# Patient Record
Sex: Female | Born: 1955 | Race: White | Hispanic: No | Marital: Married | Smoking: Never smoker
Health system: Southern US, Community
[De-identification: ages and names within clinical notes are randomized; demographics above are authoritative.]

## PROBLEM LIST (undated history)

## (undated) DIAGNOSIS — N946 Dysmenorrhea, unspecified: Secondary | ICD-10-CM

## (undated) DIAGNOSIS — Z87442 Personal history of urinary calculi: Secondary | ICD-10-CM

## (undated) DIAGNOSIS — I499 Cardiac arrhythmia, unspecified: Secondary | ICD-10-CM

## (undated) DIAGNOSIS — M1990 Unspecified osteoarthritis, unspecified site: Secondary | ICD-10-CM

## (undated) DIAGNOSIS — I10 Essential (primary) hypertension: Secondary | ICD-10-CM

## (undated) HISTORY — DX: Unspecified osteoarthritis, unspecified site: M19.90

---

## 1985-05-20 HISTORY — PX: TUBAL LIGATION: SHX77

## 1998-03-21 ENCOUNTER — Other Ambulatory Visit: Admission: RE | Admit: 1998-03-21 | Discharge: 1998-03-21 | Payer: Self-pay | Admitting: Obstetrics & Gynecology

## 2000-04-01 ENCOUNTER — Other Ambulatory Visit: Admission: RE | Admit: 2000-04-01 | Discharge: 2000-04-01 | Payer: Self-pay | Admitting: Obstetrics & Gynecology

## 2001-09-03 ENCOUNTER — Other Ambulatory Visit: Admission: RE | Admit: 2001-09-03 | Discharge: 2001-09-03 | Payer: Self-pay | Admitting: Obstetrics & Gynecology

## 2012-05-20 HISTORY — PX: CARPAL TUNNEL RELEASE: SHX101

## 2014-05-20 HISTORY — PX: REPLACEMENT TOTAL KNEE BILATERAL: SUR1225

## 2017-02-21 ENCOUNTER — Telehealth: Payer: Self-pay | Admitting: Gynecologic Oncology

## 2017-02-21 ENCOUNTER — Encounter: Payer: Self-pay | Admitting: Gynecologic Oncology

## 2017-02-21 NOTE — Telephone Encounter (Signed)
Appt has been scheduled for the pt to see Dr. Denman George on 10/15 at [facility] . Pt aware to arrive 30 minutes early. Address and insurance verified. Letter mailed.

## 2017-03-03 ENCOUNTER — Other Ambulatory Visit (HOSPITAL_BASED_OUTPATIENT_CLINIC_OR_DEPARTMENT_OTHER): Payer: 59

## 2017-03-03 ENCOUNTER — Encounter: Payer: Self-pay | Admitting: Gynecologic Oncology

## 2017-03-03 ENCOUNTER — Ambulatory Visit: Payer: 59 | Attending: Gynecologic Oncology | Admitting: Gynecologic Oncology

## 2017-03-03 VITALS — BP 138/58 | HR 71 | Temp 98.1°F | Resp 18 | Ht 66.0 in | Wt 196.7 lb

## 2017-03-03 DIAGNOSIS — Z800 Family history of malignant neoplasm of digestive organs: Secondary | ICD-10-CM | POA: Insufficient documentation

## 2017-03-03 DIAGNOSIS — R978 Other abnormal tumor markers: Secondary | ICD-10-CM | POA: Insufficient documentation

## 2017-03-03 DIAGNOSIS — M1990 Unspecified osteoarthritis, unspecified site: Secondary | ICD-10-CM | POA: Insufficient documentation

## 2017-03-03 DIAGNOSIS — N838 Other noninflammatory disorders of ovary, fallopian tube and broad ligament: Secondary | ICD-10-CM

## 2017-03-03 DIAGNOSIS — Z9851 Tubal ligation status: Secondary | ICD-10-CM | POA: Insufficient documentation

## 2017-03-03 DIAGNOSIS — I10 Essential (primary) hypertension: Secondary | ICD-10-CM | POA: Insufficient documentation

## 2017-03-03 DIAGNOSIS — Z79899 Other long term (current) drug therapy: Secondary | ICD-10-CM | POA: Insufficient documentation

## 2017-03-03 DIAGNOSIS — R7989 Other specified abnormal findings of blood chemistry: Secondary | ICD-10-CM | POA: Diagnosis not present

## 2017-03-03 DIAGNOSIS — Z87410 Personal history of cervical dysplasia: Secondary | ICD-10-CM | POA: Insufficient documentation

## 2017-03-03 DIAGNOSIS — N839 Noninflammatory disorder of ovary, fallopian tube and broad ligament, unspecified: Secondary | ICD-10-CM | POA: Diagnosis not present

## 2017-03-03 DIAGNOSIS — N8500 Endometrial hyperplasia, unspecified: Secondary | ICD-10-CM | POA: Diagnosis not present

## 2017-03-03 DIAGNOSIS — Z96653 Presence of artificial knee joint, bilateral: Secondary | ICD-10-CM | POA: Insufficient documentation

## 2017-03-03 LAB — COMPREHENSIVE METABOLIC PANEL
ALBUMIN: 3.7 g/dL (ref 3.5–5.0)
ALK PHOS: 74 U/L (ref 40–150)
ALT: 14 U/L (ref 0–55)
AST: 20 U/L (ref 5–34)
Anion Gap: 7 mEq/L (ref 3–11)
BUN: 9.7 mg/dL (ref 7.0–26.0)
CHLORIDE: 107 meq/L (ref 98–109)
CO2: 25 mEq/L (ref 22–29)
Calcium: 9.6 mg/dL (ref 8.4–10.4)
Creatinine: 0.7 mg/dL (ref 0.6–1.1)
EGFR: >60 mL/min/{1.73_m2} (ref >60)
GLUCOSE: 106 mg/dL (ref 70–140)
POTASSIUM: 4.1 meq/L (ref 3.5–5.1)
SODIUM: 139 meq/L (ref 136–145)
Total Bilirubin: 0.32 mg/dL (ref 0.20–1.20)
Total Protein: 7 g/dL (ref 6.4–8.3)

## 2017-03-03 NOTE — Patient Instructions (Signed)
Plan on having a CT scan of the abdomen and pelvis before surgery.  We will contact you with the results.                Preparing for your Surgery  Plan for surgery on April 15, 2017 with Dr. Everitt Amber at [facility] will be scheduled for a robotic assisted total hysterectomy, bilateral salpingo-oophorectomy, possible staging.  Pre-operative Testing -You will receive a phone call from presurgical testing at [facility] to arrange for a pre-operative testing appointment before your surgery.  This appointment normally occurs one to two weeks before your scheduled surgery.   -Bring your insurance card, copy of an advanced directive if applicable, medication list  -At that visit, you will be asked to sign a consent for a possible blood transfusion in case a transfusion becomes necessary during surgery.  The need for a blood transfusion is rare but having consent is a necessary part of your care.     -You should not be taking blood thinners or aspirin at least ten days prior to surgery unless instructed by your surgeon.  Day Before Surgery at [place] will be asked to take in a light diet the day before surgery.  Avoid carbonated beverages.  You will be advised to have nothing to eat or drink after midnight the evening before.    Eat a light diet the day before surgery.  Examples including soups, broths, toast, yogurt, mashed potatoes.  Things to avoid include carbonated beverages (fizzy beverages), raw fruits and raw vegetables, or beans.   If your bowels are filled with gas, your surgeon will have difficulty visualizing your pelvic organs which increases your surgical risks.  Your role in recovery Your role is to become active as soon as directed by your doctor, while still giving yourself time to heal.  Rest when you feel tired. You will be asked to do the following in order to speed your recovery:  - Cough and breathe deeply. This helps toclear and expand your  lungs and can prevent pneumonia. You may be given a spirometer to practice deep breathing. A staff member will show you how to use the spirometer. - Do mild physical activity. Walking or moving your legs help your circulation and body functions return to normal. A staff member will help you when you try to walk and will provide you with simple exercises. Do not try to get up or walk alone the first time. - Actively manage your pain. Managing your pain lets you move in comfort. We will ask you to rate your pain on a scale of zero to 10. It is your responsibility to tell your doctor or nurse where and how much you hurt so your pain can be treated.  Special Considerations -If you are diabetic, you may be placed on insulin after surgery to have closer control over your blood sugars to promote healing and recovery.  This does not mean that you will be discharged on insulin.  If applicable, your oral antidiabetics will be resumed when you are tolerating a solid diet.  -Your final pathology results from surgery should be available by the Friday after surgery and the results will be relayed to you when available.  -Dr. Lahoma Crocker is the Surgeon that assists your GYN Oncologist with surgery.  The next day after your surgery you will either see your GYN Oncologist or Dr. Lahoma Crocker.   Blood Transfusion Information WHAT IS A BLOOD TRANSFUSION?  A transfusion is the replacement of blood or some of its parts. Blood is made up of multiple cells which provide different functions.  Red blood cells carry oxygen and are used for blood loss replacement.  White blood cells fight against infection.  Platelets control bleeding.  Plasma helps clot blood.  Other blood products are available for specialized needs, such as hemophilia or other clotting disorders. BEFORE THE TRANSFUSION  Who gives blood for transfusions?   You may be able to donate blood to be used at a later date on yourself  (autologous donation).  Relatives can be asked to donate blood. This is generally not any safer than if you have received blood from a stranger. The same precautions are taken to ensure safety when a relative's blood is donated.  Healthy volunteers who are fully evaluated to make sure their blood is safe. This is blood bank blood. Transfusion therapy is the safest it has ever been in the practice of medicine. Before blood is taken from a donor, a complete history is taken to make sure that person has no history of diseases nor engages in risky social behavior (examples are intravenous drug use or sexual activity with multiple partners). The donor's travel history is screened to minimize risk of transmitting infections, such as malaria. The donated blood is tested for signs of infectious diseases, such as HIV and hepatitis. The blood is then tested to be sure it is compatible with you in order to minimize the chance of a transfusion reaction. If you or a relative donates blood, this is often done in anticipation of surgery and is not appropriate for emergency situations. It takes many days to process the donated blood. RISKS AND COMPLICATIONS Although transfusion therapy is very safe and saves many lives, the main dangers of transfusion include:   Getting an infectious disease.  Developing a transfusion reaction. This is an allergic reaction to something in the blood you were given. Every precaution is taken to prevent this. The decision to have a blood transfusion has been considered carefully by your caregiver before blood is given. Blood is not given unless the benefits outweigh the risks.

## 2017-03-03 NOTE — Progress Notes (Signed)
Consult Note: Gyn-Onc  Consult was requested by Dr. Deatra Ina for the evaluation of Christina Hines 60 y.o. female  CC:  Chief Complaint  Patient presents with  . Endometrial hyperplasia  . right ovarian mass  . elevated inhibin B    Assessment/Plan:  Christina Hines  is a 61 y.o.  year old with a solid ovarian (Right) mass, mildly elevated Inhibin B and endometrial hyperplasia. 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  I am recommending robotic assisted total hysterectomy, BSO, and possible staging.   I discussed that most GCT's are early stage and often do not require adjuvant therapy, though that will be determined by the results of staging.  I discussed operative risks including  bleeding, infection, damage to internal organs (such as bladder,ureters, bowels), blood clot, reoperation and rehospitalization.  We will obtain preoperative CT scan to evaluate for occult metastatic disease.   HPI: Christina Hines is a 61 year old woman who is seen in consultation at the request of Dr Deatra Ina for ovarian mass and elevated tumor markers in the setting of endometrial hyperplasia.  The patient reported to her OBGYN at age 61 that she had never been amenorrhea/gone through menopause. This prompted labs to be drawn which showed a low FSH. US pelvis on 01/01/17 showed a globular uterus with thickened endometrium. The right adnexa included a mass measuring 5x4.2x5.4cm.   Endometrial biopsy on 01/01/17 showed benign simple hyperplasia without atypia.  Inhibin B on 01/01/17 was mildly elevated at 17.4 (upper limit is 16.9). Repeat Inhibin B on 02/06/17 showed It to have increased to 21.4.  A mirena IUD was placed on 02/06/17 to provide progesterone to the endometrium in the setting of endogenous unopposed estrogen and hyperplasia.  The patient is otherwise fairly healthy. She has had 2 cesarean sections and a tubal ligation. She has a remote history (20+  years ago) of high grade cervical dysplasia requiring CKC. She has had normal paps since that time.   Current Meds:  Outpatient Encounter Prescriptions as of 03/03/2017  Medication Sig  . CALCIUM PO Take 300 mg by mouth daily.  . Cholecalciferol (VITAMIN D3) 2000 units TABS Take 1 tablet by mouth daily.  Marland Kitchen losartan (COZAAR) 50 MG tablet losartan 50 mg tablet  . meloxicam (MOBIC) 15 MG tablet meloxicam 15 mg tablet   No facility-administered encounter medications on file as of 03/03/2017.     Allergy: No Known Allergies  Social Hx:   Social History   Social History  . Marital status: Married    Spouse name: N/A  . Number of children: N/A  . Years of education: N/A   Occupational History  . Not on file.   Social History Main Topics  . Smoking status: Never Smoker  . Smokeless tobacco: Never Used  . Alcohol use No  . Drug use: Unknown  . Sexual activity: Yes    Birth control/ protection: Surgical, Post-menopausal   Other Topics Concern  . Not on file   Social History Narrative  . No narrative on file    Past Surgical Hx:  Past Surgical History:  Procedure Laterality Date  . CARPAL TUNNEL RELEASE  05/20/2012  . CESAREAN SECTION     x 2  . REPLACEMENT TOTAL KNEE BILATERAL Bilateral 05/20/2014  . TUBAL LIGATION Bilateral 05/20/1985    Past Medical Hx:  Past Medical History:  Diagnosis Date  . Arthritis     Past Gynecological History:  C/s x 2 No  LMP recorded.  Family Hx:  Family History  Problem Relation Age of Onset  . Liver cancer Mother     Review of Systems:  Constitutional  Feels well,    ENT Normal appearing ears and nares bilaterally Skin/Breast  No rash, sores, jaundice, itching, dryness Cardiovascular  No chest pain, shortness of breath, or edema  Pulmonary  No cough or wheeze.  Gastro Intestinal  No nausea, vomitting, or diarrhoea. No bright red blood per rectum, no abdominal pain, change in bowel movement, or constipation.  Genito  Urinary  No frequency, urgency, dysuria,  Musculo Skeletal  No myalgia, arthralgia, joint swelling or pain  Neurologic  No weakness, numbness, change in gait,  Psychology  No depression, anxiety, insomnia.   Vitals:  Blood pressure (!) 138/58, pulse 71, temperature 98.1 F (36.7 C), temperature source Oral, resp. rate 18, height 5\' 6"  (1.676 m), weight 196 lb 11.2 oz (89.2 kg), SpO2 99 %.  Physical Exam: WD in NAD Neck  Supple NROM, without any enlargements.  Lymph Node Survey No cervical supraclavicular or inguinal adenopathy Cardiovascular  Pulse normal rate, regularity and rhythm. S1 and S2 normal.  Lungs  Clear to auscultation bilateraly, without wheezes/crackles/rhonchi. Good air movement.  Skin  No rash/lesions/breakdown  Psychiatry  Alert and oriented to person, place, and time  Abdomen  Normoactive bowel sounds, abdomen soft, non-tender and nonobese without evidence of hernia.  Back No CVA tenderness Genito Urinary  Vulva/vagina: Normal external female genitalia.   No lesions. No discharge or bleeding.  Bladder/urethra:  No lesions or masses, well supported bladder  Vagina: norma  Cervix: Normal appearing, no lesions.  Uterus:  Small, very mobile, no parametrial involvement or nodularity.  Adnexa: Smooth mobile fullness in right adnexa Rectal  deferred Extremities  No bilateral cyanosis, clubbing or edema.   Donaciano Eva, MD  03/03/2017, 10:10 AM

## 2017-03-06 ENCOUNTER — Ambulatory Visit (HOSPITAL_COMMUNITY): Payer: 59

## 2017-03-13 ENCOUNTER — Ambulatory Visit (HOSPITAL_COMMUNITY): Payer: Managed Care, Other (non HMO)

## 2017-03-13 ENCOUNTER — Telehealth: Payer: Self-pay

## 2017-03-13 NOTE — Telephone Encounter (Addendum)
Told Christina Hines that her ct showed the right adnexal mass that they were aware of.  No lymph nodes noted. There is a right kidney cystic lesion that will be followed up on post operatively per Melissa Cross,NP.  She needs to pick up a disc of the Ct scan from 03-11-17 and bring it to the gyn clinic prior to her 04-03-17 surgery.  Pt will bring it by either at her pre-op visit or another day.

## 2017-03-27 NOTE — Patient Instructions (Addendum)
Christina Hines  03/27/2017   Your procedure is scheduled on: 04-03-17  Report to [facility] Main  Entrance Take [place]  elevators to 3rd floor to  [place] at [phone].    Call this number if you have problems the morning of surgery [phone]    Remember: ONLY 1 PERSON MAY GO WITH YOU TO SHORT STAY TO GET  READY MORNING OF [place].     Eat a light diet the day before surgery.  Examples including soups, broths, toast, yogurt, mashed potatoes.  Things to avoid include carbonated beverages (fizzy beverages), raw fruits and raw vegetables, or beans. If your bowels are filled with gas, your surgeon will have difficulty visualizing your pelvic organs which increases your surgical risks.  Do not eat food or drink liquids :After Midnight.     Take these medicines the morning of surgery with A SIP OF WATER: NONE                                You may not have any metal on your body including hair pins and              piercings  Do not wear jewelry, make-up, lotions, powders or perfumes, deodorant             Do not wear nail polish.  Do not shave  48 hours prior to surgery.         Do not bring valuables to the hospital. [place].  Contacts, dentures or bridgework may not be worn into surgery.  Leave suitcase in the car. After surgery it may be brought to your room.               Please read over the following fact sheets you were given: _____________________________________________________________________            [facility] - Preparing for Surgery Before surgery, you can play an important role.  Because skin is not sterile, your skin needs to be as free of germs as possible.  You can reduce the number of germs on your skin by washing with CHG (chlorahexidine gluconate) soap before surgery.  CHG is an antiseptic cleaner which kills germs and bonds with the skin to continue killing germs even after  washing. Please DO NOT use if you have an allergy to CHG or antibacterial soaps.  If your skin becomes reddened/irritated stop using the CHG and inform your nurse when you arrive at Short Stay. Do not shave (including legs and underarms) for at least 48 hours prior to the first CHG shower.  You may shave your face/neck. Please follow these instructions carefully:  1.  Shower with CHG Soap the night before surgery and the  morning of Surgery.  2.  If you choose to wash your hair, wash your hair first as usual with your  normal  shampoo.  3.  After you shampoo, rinse your hair and body thoroughly to remove the  shampoo.                           4.  Use CHG as you would any other liquid soap.  You can apply chg directly  to the skin and wash                       Gently with a scrungie or clean washcloth.  5.  Apply the CHG Soap to your body ONLY FROM THE NECK DOWN.   Do not use on face/ open                           Wound or open sores. Avoid contact with eyes, ears mouth and genitals (private parts).                       Wash face,  Genitals (private parts) with your normal soap.             6.  Wash thoroughly, paying special attention to the area where your surgery  will be performed.  7.  Thoroughly rinse your body with warm water from the neck down.  8.  DO NOT shower/wash with your normal soap after using and rinsing off  the CHG Soap.                9.  Pat yourself dry with a clean towel.            10.  Wear clean pajamas.            11.  Place clean sheets on your bed the night of your first shower and do not  sleep with pets. Day of Surgery : Do not apply any lotions/deodorants the morning of surgery.  Please wear clean clothes to the hospital/surgery center.  FAILURE TO FOLLOW THESE INSTRUCTIONS MAY RESULT IN THE CANCELLATION OF YOUR SURGERY PATIENT SIGNATURE_________________________________  NURSE  SIGNATURE__________________________________  ________________________________________________________________________   Adam Phenix  An incentive spirometer is a tool that can help keep your lungs clear and active. This tool measures how well you are filling your lungs with each breath. Taking long deep breaths may help reverse or decrease the chance of developing breathing (pulmonary) problems (especially infection) following:  A long period of time when you are unable to move or be active. BEFORE THE PROCEDURE   If the spirometer includes an indicator to show your best effort, your nurse or respiratory therapist will set it to a desired goal.  If possible, sit up straight or lean slightly forward. Try not to slouch.  Hold the incentive spirometer in an upright position. INSTRUCTIONS FOR USE  1. Sit on the edge of your bed if possible, or sit up as far as you can in bed or on a chair. 2. Hold the incentive spirometer in an upright position. 3. Breathe out normally. 4. Place the mouthpiece in your mouth and seal your lips tightly around it. 5. Breathe in slowly and as deeply as possible, raising the piston or the ball toward the top of the column. 6. Hold your breath for 3-5 seconds or for as long as possible. Allow the piston or ball to fall to the bottom of the column. 7. Remove the mouthpiece from your mouth and breathe out normally. 8. Rest for a few seconds and repeat Steps 1 through 7 at least 10 times every 1-2 hours when you are awake. Take your time and take a few normal breaths between deep breaths. 9. The spirometer may include an indicator to show your best effort. Use the indicator as a goal to work toward during each repetition. 10. After each  set of 10 deep breaths, practice coughing to be sure your lungs are clear. If you have an incision (the cut made at the time of surgery), support your incision when coughing by placing a pillow or rolled up towels firmly  against it. Once you are able to get out of bed, walk around indoors and cough well. You may stop using the incentive spirometer when instructed by your caregiver.  RISKS AND COMPLICATIONS  Take your time so you do not get dizzy or light-headed.  If you are in pain, you may need to take or ask for pain medication before doing incentive spirometry. It is harder to take a deep breath if you are having pain. AFTER USE  Rest and breathe slowly and easily.  It can be helpful to keep track of a log of your progress. Your caregiver can provide you with a simple table to help with this. If you are using the spirometer at home, follow these instructions: [place] IF:   You are having difficultly using the spirometer.  You have trouble using the spirometer as often as instructed.  Your pain medication is not giving enough relief while using the spirometer.  You develop fever of 100.5 F (38.1 C) or higher. SEEK IMMEDIATE MEDICAL CARE IF:   You cough up bloody sputum that had not been present before.  You develop fever of 102 F (38.9 C) or greater.  You develop worsening pain at or near the incision site. MAKE SURE YOU:   Understand these instructions.  Will watch your condition.  Will get help right away if you are not doing well or get worse. Document Released: 09/16/2006 Document Revised: 07/29/2011 Document Reviewed: 11/17/2006 Exit[employer] Patient Information 2014 [employer], [place].   ________________________________________________________________________  WHAT IS A BLOOD TRANSFUSION? Blood Transfusion Information  A transfusion is the replacement of blood or some of its parts. Blood is made up of multiple cells which provide different functions.  Red blood cells carry oxygen and are used for blood loss replacement.  White blood cells fight against infection.  Platelets control bleeding.  Plasma helps clot blood.  Other blood products are available for  specialized needs, such as hemophilia or other clotting disorders. BEFORE THE TRANSFUSION  Who gives blood for transfusions?   Healthy volunteers who are fully evaluated to make sure their blood is safe. This is blood bank blood. Transfusion therapy is the safest it has ever been in the practice of medicine. Before blood is taken from a donor, a complete history is taken to make sure that person has no history of diseases nor engages in risky social behavior (examples are intravenous drug use or sexual activity with multiple partners). The donor's travel history is screened to minimize risk of transmitting infections, such as malaria. The donated blood is tested for signs of infectious diseases, such as HIV and hepatitis. The blood is then tested to be sure it is compatible with you in order to minimize the chance of a transfusion reaction. If you or a relative donates blood, this is often done in anticipation of surgery and is not appropriate for emergency situations. It takes many days to process the donated blood. RISKS AND COMPLICATIONS Although transfusion therapy is very safe and saves many lives, the main dangers of transfusion include:   Getting an infectious disease.  Developing a transfusion reaction. This is an allergic reaction to something in the blood you were given. Every precaution is taken to prevent this. The decision to have  a blood transfusion has been considered carefully by your caregiver before blood is given. Blood is not given unless the benefits outweigh the risks. AFTER THE TRANSFUSION  Right after receiving a blood transfusion, you will usually feel much better and more energetic. This is especially true if your red blood cells have gotten low (anemic). The transfusion raises the level of the red blood cells which carry oxygen, and this usually causes an energy increase.  The nurse administering the transfusion will monitor you carefully for complications. HOME CARE  INSTRUCTIONS  No special instructions are needed after a transfusion. You may find your energy is better. Speak with your caregiver about any limitations on activity for underlying diseases you may have. SEEK MEDICAL CARE IF:   Your condition is not improving after your transfusion.  You develop redness or irritation at the intravenous (IV) site. SEEK IMMEDIATE MEDICAL CARE IF:  Any of the following symptoms occur over the next 12 hours:  Shaking chills.  You have a temperature by mouth above 102 F (38.9 C), not controlled by medicine.  Chest, back, or muscle pain.  People around you feel you are not acting correctly or are confused.  Shortness of breath or difficulty breathing.  Dizziness and fainting.  You get a rash or develop hives.  You have a decrease in urine output.  Your urine turns a dark color or changes to pink, red, or brown. Any of the following symptoms occur over the next 10 days:  You have a temperature by mouth above 102 F (38.9 C), not controlled by medicine.  Shortness of breath.  Weakness after normal activity.  The white part of the eye turns yellow (jaundice).  You have a decrease in the amount of urine or are urinating less often.  Your urine turns a dark color or changes to pink, red, or brown. Document Released: 05/03/2000 Document Revised: 07/29/2011 Document Reviewed: 12/21/2007 [facility] Patient Information 2014 [place], [place].  _______________________________________________________________________

## 2017-03-28 ENCOUNTER — Encounter (HOSPITAL_COMMUNITY)
Admission: RE | Admit: 2017-03-28 | Discharge: 2017-03-28 | Disposition: A | Discharge status: Home / Self Care | Payer: Managed Care, Other (non HMO) | Source: Ambulatory Visit | Attending: Obstetrics & Gynecology | Admitting: Obstetrics & Gynecology

## 2017-03-28 ENCOUNTER — Encounter (HOSPITAL_COMMUNITY): Payer: Self-pay | Admitting: Emergency Medicine

## 2017-03-28 ENCOUNTER — Other Ambulatory Visit: Payer: Self-pay

## 2017-03-28 DIAGNOSIS — Z01810 Encounter for preprocedural cardiovascular examination: Secondary | ICD-10-CM | POA: Diagnosis present

## 2017-03-28 DIAGNOSIS — R978 Other abnormal tumor markers: Secondary | ICD-10-CM | POA: Insufficient documentation

## 2017-03-28 DIAGNOSIS — I10 Essential (primary) hypertension: Secondary | ICD-10-CM | POA: Diagnosis not present

## 2017-03-28 DIAGNOSIS — N839 Noninflammatory disorder of ovary, fallopian tube and broad ligament, unspecified: Secondary | ICD-10-CM | POA: Insufficient documentation

## 2017-03-28 DIAGNOSIS — Z01812 Encounter for preprocedural laboratory examination: Secondary | ICD-10-CM | POA: Diagnosis not present

## 2017-03-28 DIAGNOSIS — N8500 Endometrial hyperplasia, unspecified: Secondary | ICD-10-CM | POA: Insufficient documentation

## 2017-03-28 HISTORY — DX: Cardiac arrhythmia, unspecified: I49.9

## 2017-03-28 HISTORY — DX: Essential (primary) hypertension: I10

## 2017-03-28 HISTORY — DX: Personal history of urinary calculi: Z87.442

## 2017-03-28 HISTORY — DX: Dysmenorrhea, unspecified: N94.6

## 2017-03-28 LAB — URINALYSIS, ROUTINE W REFLEX MICROSCOPIC
BACTERIA UA: NONE SEEN
Bilirubin Urine: NEGATIVE
Glucose, UA: >=500 mg/dL — AB
Ketones, ur: NEGATIVE mg/dL
Leukocytes, UA: NEGATIVE
Nitrite: NEGATIVE
PH: 7 (ref 5.0–8.0)
Protein, ur: NEGATIVE mg/dL
SPECIFIC GRAVITY, URINE: 1.002 — AB (ref 1.005–1.030)

## 2017-03-28 LAB — COMPREHENSIVE METABOLIC PANEL
ALK PHOS: 66 U/L (ref 38–126)
ALT: 18 U/L (ref 14–54)
ANION GAP: 8 (ref 5–15)
AST: 23 U/L (ref 15–41)
Albumin: 3.9 g/dL (ref 3.5–5.0)
BUN: 11 mg/dL (ref 6–20)
CALCIUM: 9.2 mg/dL (ref 8.9–10.3)
CHLORIDE: 106 mmol/L (ref 101–111)
CO2: 25 mmol/L (ref 22–32)
CREATININE: 0.61 mg/dL (ref 0.44–1.00)
GFR calc non Af Amer: >60 mL/min (ref >60)
Glucose, Bld: 104 mg/dL — ABNORMAL HIGH (ref 65–99)
Potassium: 4.4 mmol/L (ref 3.5–5.1)
SODIUM: 139 mmol/L (ref 135–145)
Total Bilirubin: 0.5 mg/dL (ref 0.3–1.2)
Total Protein: 6.8 g/dL (ref 6.5–8.1)

## 2017-03-28 LAB — CBC
HCT: 34.2 % — ABNORMAL LOW (ref 36.0–46.0)
Hemoglobin: 10.1 g/dL — ABNORMAL LOW (ref 12.0–15.0)
MCH: 21.3 pg — AB (ref 26.0–34.0)
MCHC: 29.5 g/dL — AB (ref 30.0–36.0)
MCV: 72.2 fL — AB (ref 78.0–100.0)
PLATELETS: 484 10*3/uL — AB (ref 150–400)
RBC: 4.74 MIL/uL (ref 3.87–5.11)
RDW: 16.8 % — ABNORMAL HIGH (ref 11.5–15.5)
WBC: 6.1 10*3/uL (ref 4.0–10.5)

## 2017-03-28 LAB — ABO/RH: ABO/RH(D): A POS

## 2017-03-28 NOTE — Progress Notes (Signed)
Cbc and UA result routed via epic to Dr Janie Morning surgeon

## 2017-03-28 NOTE — Progress Notes (Signed)
[place] 06-30-14 on chart   EKG 03-21-15 [facility] on chart

## 2017-03-28 NOTE — Progress Notes (Signed)
Faxed request for ECHO and EKG to [facility] in [place] at [facility].

## 2017-03-29 LAB — CA 125: Cancer Antigen (CA) 125: 16.1 U/mL (ref 0.0–38.1)

## 2017-04-01 ENCOUNTER — Other Ambulatory Visit (HOSPITAL_COMMUNITY): Payer: 59

## 2017-04-02 NOTE — Anesthesia Preprocedure Evaluation (Addendum)
Anesthesia Evaluation  Patient identified by MRN, date of birth, ID band Patient awake    Reviewed: Allergy & Precautions, NPO status , Patient's Chart, lab work & pertinent test results  Airway Mallampati: II  TM Distance: >3 FB Neck ROM: Full    Dental  (+) Dental Advisory Given   Pulmonary neg pulmonary ROS,    Pulmonary exam normal breath sounds clear to auscultation       Cardiovascular hypertension, Pt. on medications Normal cardiovascular exam Rhythm:Regular Rate:Normal     Neuro/Psych negative neurological ROS  negative psych ROS   GI/Hepatic negative GI ROS, Neg liver ROS,   Endo/Other  Obesity  Renal/GU negative Renal ROS  negative genitourinary   Musculoskeletal  (+) Arthritis ,   Abdominal   Peds  Hematology  (+) anemia ,   Anesthesia Other Findings   Reproductive/Obstetrics                             Anesthesia Physical Anesthesia Plan  ASA: II  Anesthesia Plan: General   Post-op Pain Management:    Induction: Intravenous  PONV Risk Score and Plan: 4 or greater and Treatment may vary due to age or medical condition  Airway Management Planned: Oral ETT  Additional Equipment: None  Intra-op Plan:   Post-operative Plan: Extubation in OR  Informed Consent: I have reviewed the patients History and Physical, chart, labs and discussed the procedure including the risks, benefits and alternatives for the proposed anesthesia with the patient or authorized representative who has indicated his/her understanding and acceptance.   Dental advisory given  Plan Discussed with: CRNA  Anesthesia Plan Comments:         Anesthesia Quick Evaluation

## 2017-04-03 ENCOUNTER — Ambulatory Visit (HOSPITAL_COMMUNITY): Payer: Managed Care, Other (non HMO) | Admitting: Anesthesiology

## 2017-04-03 ENCOUNTER — Other Ambulatory Visit: Payer: Self-pay

## 2017-04-03 ENCOUNTER — Encounter (HOSPITAL_COMMUNITY): Payer: Self-pay

## 2017-04-03 ENCOUNTER — Ambulatory Visit (HOSPITAL_COMMUNITY)
Admission: RE | Admit: 2017-04-03 | Discharge: 2017-04-04 | Disposition: A | Discharge status: Home / Self Care | Payer: Managed Care, Other (non HMO) | Source: Ambulatory Visit | Attending: Obstetrics & Gynecology | Admitting: Obstetrics & Gynecology

## 2017-04-03 ENCOUNTER — Encounter (HOSPITAL_COMMUNITY)
Admission: RE | Disposition: A | Discharge status: Home / Self Care | Payer: Self-pay | Source: Ambulatory Visit | Attending: Obstetrics & Gynecology

## 2017-04-03 DIAGNOSIS — E669 Obesity, unspecified: Secondary | ICD-10-CM | POA: Insufficient documentation

## 2017-04-03 DIAGNOSIS — N8500 Endometrial hyperplasia, unspecified: Secondary | ICD-10-CM | POA: Diagnosis present

## 2017-04-03 DIAGNOSIS — I10 Essential (primary) hypertension: Secondary | ICD-10-CM | POA: Diagnosis not present

## 2017-04-03 DIAGNOSIS — Z791 Long term (current) use of non-steroidal anti-inflammatories (NSAID): Secondary | ICD-10-CM | POA: Diagnosis not present

## 2017-04-03 DIAGNOSIS — N838 Other noninflammatory disorders of ovary, fallopian tube and broad ligament: Secondary | ICD-10-CM

## 2017-04-03 DIAGNOSIS — S3141XA Laceration without foreign body of vagina and vulva, initial encounter: Secondary | ICD-10-CM | POA: Diagnosis not present

## 2017-04-03 DIAGNOSIS — Z6832 Body mass index (BMI) 32.0-32.9, adult: Secondary | ICD-10-CM | POA: Diagnosis not present

## 2017-04-03 DIAGNOSIS — Z96653 Presence of artificial knee joint, bilateral: Secondary | ICD-10-CM | POA: Insufficient documentation

## 2017-04-03 DIAGNOSIS — Y838 Other surgical procedures as the cause of abnormal reaction of the patient, or of later complication, without mention of misadventure at the time of the procedure: Secondary | ICD-10-CM | POA: Insufficient documentation

## 2017-04-03 DIAGNOSIS — N840 Polyp of corpus uteri: Secondary | ICD-10-CM | POA: Insufficient documentation

## 2017-04-03 DIAGNOSIS — Z79899 Other long term (current) drug therapy: Secondary | ICD-10-CM | POA: Insufficient documentation

## 2017-04-03 DIAGNOSIS — R1900 Intra-abdominal and pelvic swelling, mass and lump, unspecified site: Secondary | ICD-10-CM | POA: Diagnosis present

## 2017-04-03 DIAGNOSIS — N800 Endometriosis of uterus: Secondary | ICD-10-CM | POA: Diagnosis not present

## 2017-04-03 DIAGNOSIS — N72 Inflammatory disease of cervix uteri: Secondary | ICD-10-CM | POA: Insufficient documentation

## 2017-04-03 DIAGNOSIS — D270 Benign neoplasm of right ovary: Secondary | ICD-10-CM | POA: Insufficient documentation

## 2017-04-03 HISTORY — PX: ROBOTIC ASSISTED TOTAL HYSTERECTOMY WITH BILATERAL SALPINGO OOPHERECTOMY: SHX6086

## 2017-04-03 LAB — TYPE AND SCREEN
ABO/RH(D): A POS
ANTIBODY SCREEN: NEGATIVE

## 2017-04-03 SURGERY — HYSTERECTOMY, TOTAL, ROBOT-ASSISTED, LAPAROSCOPIC, WITH BILATERAL SALPINGO-OOPHORECTOMY
Anesthesia: General | Laterality: Bilateral

## 2017-04-03 MED ORDER — CEFAZOLIN SODIUM-DEXTROSE 2-4 GM/100ML-% IV SOLN
2.0000 g | INTRAVENOUS | Status: AC
  Administered 2017-04-03: 2 g via INTRAVENOUS
  Filled 2017-04-03: qty 100

## 2017-04-03 MED ORDER — PROMETHAZINE HCL 25 MG/ML IJ SOLN: 6.2500 mg | INTRAMUSCULAR | Status: DC | PRN

## 2017-04-03 MED ORDER — LOSARTAN POTASSIUM 50 MG PO TABS
50.0000 mg | ORAL_TABLET | Freq: Every day | ORAL | Status: DC
  Administered 2017-04-03 – 2017-04-04 (×2): 50 mg via ORAL
  Filled 2017-04-03 (×2): qty 1

## 2017-04-03 MED ORDER — KETAMINE HCL 10 MG/ML IJ SOLN
INTRAMUSCULAR | Status: DC | PRN
  Administered 2017-04-03: 30 mg via INTRAVENOUS

## 2017-04-03 MED ORDER — SUGAMMADEX SODIUM 200 MG/2ML IV SOLN
INTRAVENOUS | Status: DC | PRN
  Administered 2017-04-03: 200 mg via INTRAVENOUS

## 2017-04-03 MED ORDER — PROPOFOL 10 MG/ML IV BOLUS
INTRAVENOUS | Status: AC
  Filled 2017-04-03: qty 20

## 2017-04-03 MED ORDER — GABAPENTIN 300 MG PO CAPS
300.0000 mg | ORAL_CAPSULE | ORAL | Status: AC
  Administered 2017-04-03: 300 mg via ORAL
  Filled 2017-04-03: qty 1

## 2017-04-03 MED ORDER — ONDANSETRON HCL 4 MG PO TABS: 4.0000 mg | ORAL_TABLET | Freq: Four times a day (QID) | ORAL | Status: DC | PRN

## 2017-04-03 MED ORDER — DEXAMETHASONE SODIUM PHOSPHATE 4 MG/ML IJ SOLN: 4.0000 mg | INTRAMUSCULAR | Status: DC

## 2017-04-03 MED ORDER — ENOXAPARIN SODIUM 40 MG/0.4ML ~~LOC~~ SOLN
40.0000 mg | SUBCUTANEOUS | Status: AC
  Administered 2017-04-03: 40 mg via SUBCUTANEOUS
  Filled 2017-04-03: qty 0.4

## 2017-04-03 MED ORDER — LIDOCAINE 2% (20 MG/ML) 5 ML SYRINGE
INTRAMUSCULAR | Status: DC | PRN
  Administered 2017-04-03: 1.5 mg/kg/h via INTRAVENOUS

## 2017-04-03 MED ORDER — PROMETHAZINE HCL 25 MG/ML IJ SOLN
12.5000 mg | Freq: Four times a day (QID) | INTRAMUSCULAR | Status: DC | PRN
  Administered 2017-04-03: 12.5 mg via INTRAVENOUS
  Filled 2017-04-03: qty 1

## 2017-04-03 MED ORDER — HYDROMORPHONE HCL 1 MG/ML IJ SOLN: 0.2000 mg | INTRAMUSCULAR | Status: DC | PRN

## 2017-04-03 MED ORDER — GABAPENTIN 300 MG PO CAPS: 600.0000 mg | ORAL_CAPSULE | Freq: Every day | ORAL | Status: DC

## 2017-04-03 MED ORDER — KETAMINE HCL-SODIUM CHLORIDE 100-0.9 MG/10ML-% IV SOSY
PREFILLED_SYRINGE | INTRAVENOUS | Status: AC
  Filled 2017-04-03: qty 10

## 2017-04-03 MED ORDER — FENTANYL CITRATE (PF) 250 MCG/5ML IJ SOLN
INTRAMUSCULAR | Status: AC
  Filled 2017-04-03: qty 5

## 2017-04-03 MED ORDER — PROPOFOL 10 MG/ML IV BOLUS
INTRAVENOUS | Status: DC | PRN
  Administered 2017-04-03: 120 mg via INTRAVENOUS

## 2017-04-03 MED ORDER — IBUPROFEN 800 MG PO TABS
800.0000 mg | ORAL_TABLET | Freq: Three times a day (TID) | ORAL | Status: DC | PRN
  Administered 2017-04-03 – 2017-04-04 (×2): 800 mg via ORAL
  Filled 2017-04-03 (×2): qty 1

## 2017-04-03 MED ORDER — MIDAZOLAM HCL 2 MG/2ML IJ SOLN
INTRAMUSCULAR | Status: AC
  Filled 2017-04-03: qty 2

## 2017-04-03 MED ORDER — RINGERS IRRIGATION IR SOLN
Status: DC | PRN
Start: 2017-04-03 — End: 2017-04-03
  Administered 2017-04-03: 1

## 2017-04-03 MED ORDER — LIDOCAINE HCL 2 % IJ SOLN
INTRAMUSCULAR | Status: AC
  Filled 2017-04-03: qty 20

## 2017-04-03 MED ORDER — KCL IN DEXTROSE-NACL 20-5-0.45 MEQ/L-%-% IV SOLN
INTRAVENOUS | Status: DC
  Administered 2017-04-03: 13:00:00 via INTRAVENOUS
  Administered 2017-04-04: 50 mL/h via INTRAVENOUS
  Filled 2017-04-03 (×2): qty 1000

## 2017-04-03 MED ORDER — ONDANSETRON HCL 4 MG/2ML IJ SOLN
INTRAMUSCULAR | Status: DC | PRN
  Administered 2017-04-03: 4 mg via INTRAVENOUS

## 2017-04-03 MED ORDER — FENTANYL CITRATE (PF) 100 MCG/2ML IJ SOLN: 25.0000 ug | INTRAMUSCULAR | Status: DC | PRN

## 2017-04-03 MED ORDER — ACETAMINOPHEN 650 MG RE SUPP
650.0000 mg | Freq: Four times a day (QID) | RECTAL | Status: DC | PRN
  Administered 2017-04-03: 650 mg via RECTAL
  Filled 2017-04-03: qty 1

## 2017-04-03 MED ORDER — TRAMADOL HCL 50 MG PO TABS
100.0000 mg | ORAL_TABLET | Freq: Four times a day (QID) | ORAL | Status: DC | PRN
Start: 2017-04-03 — End: 2017-04-04
  Administered 2017-04-03: 100 mg via ORAL
  Filled 2017-04-03: qty 2

## 2017-04-03 MED ORDER — LACTATED RINGERS IV SOLN
INTRAVENOUS | Status: DC | PRN
  Administered 2017-04-03 (×2): via INTRAVENOUS

## 2017-04-03 MED ORDER — SENNOSIDES-DOCUSATE SODIUM 8.6-50 MG PO TABS: 2.0000 | ORAL_TABLET | Freq: Every day | ORAL | Status: DC

## 2017-04-03 MED ORDER — CELECOXIB 200 MG PO CAPS
400.0000 mg | ORAL_CAPSULE | ORAL | Status: AC
  Administered 2017-04-03: 400 mg via ORAL
  Filled 2017-04-03: qty 2

## 2017-04-03 MED ORDER — SCOPOLAMINE 1 MG/3DAYS TD PT72
1.0000 | MEDICATED_PATCH | TRANSDERMAL | Status: DC
  Administered 2017-04-03: 1.5 mg via TRANSDERMAL
  Filled 2017-04-03: qty 1

## 2017-04-03 MED ORDER — ONDANSETRON HCL 4 MG/2ML IJ SOLN
4.0000 mg | Freq: Four times a day (QID) | INTRAMUSCULAR | Status: DC | PRN
  Administered 2017-04-03: 4 mg via INTRAVENOUS
  Filled 2017-04-03: qty 2

## 2017-04-03 MED ORDER — DEXAMETHASONE SODIUM PHOSPHATE 10 MG/ML IJ SOLN
INTRAMUSCULAR | Status: DC | PRN
  Administered 2017-04-03: 10 mg via INTRAVENOUS

## 2017-04-03 MED ORDER — ROCURONIUM BROMIDE 50 MG/5ML IV SOSY
PREFILLED_SYRINGE | INTRAVENOUS | Status: AC
  Filled 2017-04-03: qty 10

## 2017-04-03 MED ORDER — SCOPOLAMINE 1 MG/3DAYS TD PT72
1.0000 | MEDICATED_PATCH | TRANSDERMAL | Status: DC
  Filled 2017-04-03: qty 1

## 2017-04-03 MED ORDER — ACETAMINOPHEN 500 MG PO TABS
1000.0000 mg | ORAL_TABLET | ORAL | Status: AC
  Administered 2017-04-03: 1000 mg via ORAL
  Filled 2017-04-03: qty 2

## 2017-04-03 MED ORDER — FENTANYL CITRATE (PF) 100 MCG/2ML IJ SOLN
INTRAMUSCULAR | Status: DC | PRN
Start: 2017-04-03 — End: 2017-04-03
  Administered 2017-04-03: 25 ug via INTRAVENOUS
  Administered 2017-04-03 (×2): 50 ug via INTRAVENOUS
  Administered 2017-04-03: 100 ug via INTRAVENOUS
  Administered 2017-04-03: 25 ug via INTRAVENOUS

## 2017-04-03 MED ORDER — ACETAMINOPHEN 500 MG PO TABS
1000.0000 mg | ORAL_TABLET | Freq: Four times a day (QID) | ORAL | Status: DC | PRN
  Administered 2017-04-04: 1000 mg via ORAL
  Filled 2017-04-03: qty 2

## 2017-04-03 MED ORDER — GABAPENTIN 600 MG PO TABS
600.0000 mg | ORAL_TABLET | Freq: Every day | ORAL | Status: DC
  Filled 2017-04-03: qty 1

## 2017-04-03 MED ORDER — MIDAZOLAM HCL 5 MG/5ML IJ SOLN
INTRAMUSCULAR | Status: DC | PRN
  Administered 2017-04-03: 2 mg via INTRAVENOUS

## 2017-04-03 MED ORDER — SUGAMMADEX SODIUM 200 MG/2ML IV SOLN
INTRAVENOUS | Status: AC
  Filled 2017-04-03: qty 2

## 2017-04-03 MED ORDER — ONDANSETRON HCL 4 MG/2ML IJ SOLN
INTRAMUSCULAR | Status: AC
  Filled 2017-04-03: qty 2

## 2017-04-03 MED ORDER — HYDROMORPHONE HCL 2 MG PO TABS: 2.0000 mg | ORAL_TABLET | ORAL | Status: DC | PRN

## 2017-04-03 MED ORDER — ROCURONIUM BROMIDE 100 MG/10ML IV SOLN
INTRAVENOUS | Status: DC | PRN
  Administered 2017-04-03: 20 mg via INTRAVENOUS
  Administered 2017-04-03: 10 mg via INTRAVENOUS
  Administered 2017-04-03: 50 mg via INTRAVENOUS
  Administered 2017-04-03: 10 mg via INTRAVENOUS

## 2017-04-03 MED ORDER — DEXAMETHASONE SODIUM PHOSPHATE 10 MG/ML IJ SOLN
INTRAMUSCULAR | Status: AC
  Filled 2017-04-03: qty 1

## 2017-04-03 MED ORDER — STERILE WATER FOR IRRIGATION IR SOLN
Status: DC | PRN
  Administered 2017-04-03: 1000 mL

## 2017-04-03 SURGICAL SUPPLY — 40 items
ADH SKN CLS APL DERMABOND .7 (GAUZE/BANDAGES/DRESSINGS) ×1
BAG SPEC RTRVL LRG 6X4 10 (ENDOMECHANICALS)
COVER BACK TABLE 60X90IN (DRAPES) ×2 IMPLANT
COVER SURGICAL LIGHT HANDLE (MISCELLANEOUS) ×3 IMPLANT
COVER TIP SHEARS 8 DVNC (MISCELLANEOUS) ×1 IMPLANT
COVER TIP SHEARS 8MM DA VINCI (MISCELLANEOUS) ×2
DERMABOND ADVANCED (GAUZE/BANDAGES/DRESSINGS) ×2
DERMABOND ADVANCED .7 DNX12 (GAUZE/BANDAGES/DRESSINGS) IMPLANT
DRAPE ARM DVNC X/XI (DISPOSABLE) ×4 IMPLANT
DRAPE COLUMN DVNC XI (DISPOSABLE) ×1 IMPLANT
DRAPE DA VINCI XI ARM (DISPOSABLE) ×8
DRAPE DA VINCI XI COLUMN (DISPOSABLE) ×2
DRAPE SHEET LG 3/4 BI-LAMINATE (DRAPES) ×3 IMPLANT
DRAPE SURG IRRIG POUCH 19X23 (DRAPES) ×3 IMPLANT
ELECT REM PT RETURN 15FT ADLT (MISCELLANEOUS) ×3 IMPLANT
GLOVE BIO SURGEON STRL SZ 6.5 (GLOVE) ×4 IMPLANT
GLOVE BIO SURGEON STRL SZ7.5 (GLOVE) ×9 IMPLANT
GLOVE BIO SURGEONS STRL SZ 6.5 (GLOVE) ×2
GLOVE INDICATOR 8.0 STRL GRN (GLOVE) ×6 IMPLANT
GOWN STRL NON-REIN LRG LVL3 (GOWN DISPOSABLE) ×3 IMPLANT
GOWN STRL REUS W/TWL XL LVL3 (GOWN DISPOSABLE) ×6 IMPLANT
HOLDER FOLEY CATH W/STRAP (MISCELLANEOUS) ×3 IMPLANT
IRRIG SUCT STRYKERFLOW 2 WTIP (MISCELLANEOUS) ×3
IRRIGATION SUCT STRKRFLW 2 WTP (MISCELLANEOUS) ×1 IMPLANT
MANIPULATOR UTERINE 4.5 ZUMI (MISCELLANEOUS) ×2 IMPLANT
PACK ROBOT GYN CUSTOM WL (TRAY / TRAY PROCEDURE) ×3 IMPLANT
PAD POSITIONING PINK XL (MISCELLANEOUS) ×3 IMPLANT
POUCH SPECIMEN RETRIEVAL 10MM (ENDOMECHANICALS) IMPLANT
SEAL CANN UNIV 5-8 DVNC XI (MISCELLANEOUS) ×4 IMPLANT
SEAL XI 5MM-8MM UNIVERSAL (MISCELLANEOUS) ×8
SET TRI-LUMEN FLTR TB AIRSEAL (TUBING) ×3 IMPLANT
SOLUTION ELECTROLUBE (MISCELLANEOUS) ×3 IMPLANT
SUT VIC AB 0 CT1 27 (SUTURE) ×6
SUT VIC AB 0 CT1 27XBRD ANTBC (SUTURE) IMPLANT
SUT VLOC 180 0 9IN  GS21 (SUTURE)
SUT VLOC 180 0 9IN GS21 (SUTURE) IMPLANT
TOWEL OR NON WOVEN STRL DISP B (DISPOSABLE) ×3 IMPLANT
TRAP SPECIMEN MUCOUS 40CC (MISCELLANEOUS) ×2 IMPLANT
UNDERPAD 30X30 (UNDERPADS AND DIAPERS) ×3 IMPLANT
WATER STERILE IRR 1000ML POUR (IV SOLUTION) ×6 IMPLANT

## 2017-04-03 NOTE — Anesthesia Postprocedure Evaluation (Signed)
Anesthesia Post Note  Patient: Christina Hines  Procedure(s) Performed: XI ROBOTIC ASSISTED TOTAL HYSTERECTOMY WITH BILATERAL SALPINGO OOPHORECTOMY (Bilateral )     Patient location during evaluation: PACU Anesthesia Type: General Level of consciousness: awake and alert Pain management: pain level controlled Vital Signs Assessment: post-procedure vital signs reviewed and stable Respiratory status: spontaneous breathing, nonlabored ventilation and respiratory function stable Cardiovascular status: blood pressure returned to baseline and stable Postop Assessment: no apparent nausea or vomiting Anesthetic complications: no    Last Vitals:  Vitals:   04/03/17 1145 04/03/17 1200  BP: 137/68 109/89  Pulse: 64 88  Resp: 16 16  Temp: 36.6 C 36.7 C  SpO2: 99% 100%    Last Pain:  Vitals:   04/03/17 1221  TempSrc:   PainSc: 3                  Audry Pili

## 2017-04-03 NOTE — Anesthesia Procedure Notes (Signed)
Procedure Name: Intubation Date/Time: 04/03/2017 7:42 AM Performed by: Glory Buff, CRNA Pre-anesthesia Checklist: Patient identified, Emergency Drugs available, Suction available and Patient being monitored Patient Re-evaluated:Patient Re-evaluated prior to induction Oxygen Delivery Method: Circle system utilized Preoxygenation: Pre-oxygenation with 100% oxygen Induction Type: IV induction Ventilation: Mask ventilation without difficulty Laryngoscope Size: Miller and 3 Grade View: Grade II Tube type: Oral Tube size: 7.0 mm Number of attempts: 1 Airway Equipment and Method: Stylet and Oral airway Placement Confirmation: ETT inserted through vocal cords under direct vision,  positive ETCO2 and breath sounds checked- equal and bilateral Secured at: 20 cm Tube secured with: Tape Dental Injury: Teeth and Oropharynx as per pre-operative assessment

## 2017-04-03 NOTE — Transfer of Care (Signed)
Immediate Anesthesia Transfer of Care Note  Patient: Christina Hines  Procedure(s) Performed: XI ROBOTIC ASSISTED TOTAL HYSTERECTOMY WITH BILATERAL SALPINGO OOPHORECTOMY (Bilateral )  Patient Location: PACU  Anesthesia Type:General  Level of Consciousness: awake, alert  and oriented  Airway & Oxygen Therapy: Patient Spontanous Breathing and Patient connected to face mask oxygen  Post-op Assessment: Report given to RN and Post -op Vital signs reviewed and stable  Post vital signs: Reviewed and stable  Last Vitals:  Vitals:   04/03/17 0516  BP: (!) 142/67  Pulse: 71  Resp: 16  Temp: 36.8 C  SpO2: 100%    Last Pain:  Vitals:   04/03/17 0516  TempSrc: Oral      Patients Stated Pain Goal: 4 (58/30/94 0768)  Complications: No apparent anesthesia complications

## 2017-04-03 NOTE — H&P (Signed)
History and Physical: Gyn-Onc  Consult was requested by Dr. Deatra Ina for the evaluation of Christina Hines 61 y.o. female  CC:     Chief Complaint  Patient presents with  . Endometrial hyperplasia  . right ovarian mass  . elevated inhibin B    Assessment/Plan:  Christina Hines  is a 61 y.o.  year old with a solid ovarian (Right) mass, mildly elevated Inhibin B and endometrial hyperplasia. 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  I am recommending robotic assisted total hysterectomy, BSO, and possible staging.   I discussed that most GCT's are early stage and often do not require adjuvant therapy, though that will be determined by the results of staging.  I discussed operative risks including  bleeding, infection, damage to internal organs (such as bladder,ureters, bowels), blood clot, reoperation and rehospitalization.  We will obtain preoperative CT scan to evaluate for occult metastatic disease.   HPI: Christina Hines is a 61 year old woman who is seen in consultation at the request of Dr Deatra Ina for ovarian mass and elevated tumor markers in the setting of endometrial hyperplasia.  The patient reported to her OBGYN at age 61 that she had never been amenorrhea/gone through menopause. This prompted labs to be drawn which showed a low FSH. US pelvis on 01/01/17 showed a globular uterus with thickened endometrium. The right adnexa included a mass measuring 5x4.2x5.4cm.   Endometrial biopsy on 01/01/17 showed benign simple hyperplasia without atypia.  Inhibin B on 01/01/17 was mildly elevated at 17.4 (upper limit is 16.9). Repeat Inhibin B on 02/06/17 showed It to have increased to 21.4.  A mirena IUD was placed on 02/06/17 to provide progesterone to the endometrium in the setting of endogenous unopposed estrogen and hyperplasia.  The patient is otherwise fairly healthy. She has had 2 cesarean sections and a tubal ligation. She  has a remote history (20+ years ago) of high grade cervical dysplasia requiring CKC. She has had normal paps since that time.   Current Meds:      Outpatient Encounter Prescriptions as of 03/03/2017  Medication Sig  . CALCIUM PO Take 300 mg by mouth daily.  . Cholecalciferol (VITAMIN D3) 2000 units TABS Take 1 tablet by mouth daily.  Marland Kitchen losartan (COZAAR) 50 MG tablet losartan 50 mg tablet  . meloxicam (MOBIC) 15 MG tablet meloxicam 15 mg tablet   No facility-administered encounter medications on file as of 03/03/2017.     Allergy: No Known Allergies  Social Hx:   Social History        Social History  . Marital status: Married    Spouse name: N/A  . Number of children: N/A  . Years of education: N/A      Occupational History  . Not on file.        Social History Main Topics  . Smoking status: Never Smoker  . Smokeless tobacco: Never Used  . Alcohol use No  . Drug use: Unknown  . Sexual activity: Yes    Birth control/ protection: Surgical, Post-menopausal       Other Topics Concern  . Not on file      Social History Narrative  . No narrative on file    Past Surgical Hx:       Past Surgical History:  Procedure Laterality Date  . CARPAL TUNNEL RELEASE  05/20/2012  . CESAREAN SECTION     x 2  . REPLACEMENT TOTAL KNEE BILATERAL Bilateral 05/20/2014  .  TUBAL LIGATION Bilateral 05/20/1985    Past Medical Hx:      Past Medical History:  Diagnosis Date  . Arthritis     Past Gynecological History:  C/s x 2 No LMP recorded.  Family Hx:       Family History  Problem Relation Age of Onset  . Liver cancer Mother     Review of Systems:  Constitutional  Feels well,    ENT Normal appearing ears and nares bilaterally Skin/Breast  No rash, sores, jaundice, itching, dryness Cardiovascular  No chest pain, shortness of breath, or edema  Pulmonary  No cough or wheeze.  Gastro Intestinal  No nausea, vomitting, or diarrhoea.  No bright red blood per rectum, no abdominal pain, change in bowel movement, or constipation.  Genito Urinary  No frequency, urgency, dysuria,  Musculo Skeletal  No myalgia, arthralgia, joint swelling or pain  Neurologic  No weakness, numbness, change in gait,  Psychology  No depression, anxiety, insomnia.   Vitals:  Blood pressure (!) 138/58, pulse 71, temperature 98.1 F (36.7 C), temperature source Oral, resp. rate 18, height 5\' 6"  (1.676 m), weight 196 lb 11.2 oz (89.2 kg), SpO2 99 %.  Physical Exam: Per Dr. Denman George WD in NAD Neck  Supple NROM, without any enlargements.  Lymph Node Survey No cervical supraclavicular or inguinal adenopathy Cardiovascular  Pulse normal rate, regularity and rhythm. S1 and S2 normal.  Lungs  Clear to auscultation bilateraly, without wheezes/crackles/rhonchi. Good air movement.  Skin  No rash/lesions/breakdown  Psychiatry  Alert and oriented to person, place, and time  Abdomen  Normoactive bowel sounds, abdomen soft, non-tender and nonobese without evidence of hernia.  Back No CVA tenderness Genito Urinary  Vulva/vagina: Normal external female genitalia.   No lesions. No discharge or bleeding.             Bladder/urethra:  No lesions or masses, well supported bladder             Vagina: norma             Cervix: Normal appearing, no lesions.             Uterus:  Small, very mobile, no parametrial involvement or nodularity.             Adnexa: Smooth mobile fullness in right adnexa Rectal  deferred Extremities  No bilateral cyanosis, clubbing or edema.

## 2017-04-03 NOTE — Interval H&P Note (Signed)
History and Physical Interval Note:  04/03/2017 7:29 AM  Christina Hines  has presented today for surgery, with the diagnosis of ovarian mass  The various methods of treatment have been discussed with the patient and family. After consideration of risks, benefits and other options for treatment, the patient has consented to  Procedure(s): XI ROBOTIC ASSISTED TOTAL HYSTERECTOMY WITH BILATERAL SALPINGO OOPHORECTOMY, POSSIBLE STAGING (Bilateral) as a surgical intervention .  The patient's history has been reviewed, patient examined, no change in status, stable for surgery.  I have reviewed the patient's chart and labs.  Questions were answered to the patient's satisfaction.     [place], [facility]

## 2017-04-03 NOTE — Op Note (Addendum)
Surgeon: Janie Morning   Assistants: Lahoma Crocker MD  Pre-operative Diagnosis: adnexal mass, abnormal uterine bleeding  Post-operative Diagnosis: right ovarian fibrothecoma, uterine polyps  Operation: Robotic-assisted laparoscopic hysterectomy with bilateral salpingoophorectomy and washings  Surgeon: Janie Morning  Assistant Surgeon: Lahoma Crocker MD  Anesthesia: GET  Operative Findings:  :   1.  No extrauterine disease. 2.  A 10 cm uterus. 3.  Right 6 cm  Solid adnexa mass . 4. 5.  Normal appearing cervix.    Procedure Details  The patient was seen in the Holding Room. The risks, benefits, complications, treatment options, and expected outcomes were discussed with the patient.  The patient concurred with the proposed plan, giving informed consent.  The site of surgery properly noted/marked. The patient was identified as Christina Hines and the procedure verified as a Robotic-assisted hysterectomy with bilateral salpingo oophorectomy. A Time Out was held and the above information confirmed.  After induction of anesthesia, the patient was draped and prepped in the usual sterile manner. Pt was placed in supine position after anesthesia and draped and prepped in the usual sterile manner. The abdominal drape was placed after the CholoraPrep had been allowed to dry for 3 minutes.  Her arms were tucked to her side with all appropriate precautions.  The chest was secured to the table.  The patient was placed in the semi-lithotomy position in [place].  The perineum was prepped with Betadine.  Foley catheter was placed.  A sterile speculum was placed in the vagina.  The cervix was grasped with a single-tooth tenaculum and dilated with Kennon Rounds dilators.  The ZUMI uterine manipulator with a medium colpotomizer ring was placed without difficulty.  A pneum occluder balloon was placed over the manipulator.  A second time-out was performed.  OG tube placement was confirmed and to  suction.   Procedure:  The patient was brought to the operating room where general anesthesia was administered with no complications.  The patient was placed in the dorsal lithotomy position in padded Allen stirrups.  The arms were tucked at the sides with gel pads protecting the elbows and foam protecting the hands. The patient was then prepped.  A Foley was placed to gravity.  The IUD was removed.   A large size KOH ring was used to place around the cervix after the cervix had been dilated and then a RUMI manipulator was attached in the normal manner.  The patient was then draped in the normal manner.  Next, a 5 mm skin incision was made 1 cm below the subcostal margin in the midclavicular line.  The 5 mm Optiview port and scope was used for direct entry.  Opening pressure was under 10 mm CO2.  The abdomen was insufflated and the findings were noted as above.   At this point and all points during the procedure, the patient's intra-abdominal pressure did not exceed 15 mmHg. Next, a 10 mm skin incision was made about 2 cm above the umbilicus and a right and two  left ports were placed about 7 cm lateral  And the left and 7cm and 15 cm lateral to the umbilicus on the left.    All ports were placed under direct visualization.  The patient was placed in steep Trendelenburg.  Bowel was suboptimally packed  away into the upper abdomen.  The robot was docked in the normal manner.  The hysterectomy was started after the round ligament on the right side was incised and the retroperitoneum was entered and  the pararectal space was developed.  The ureter was noted to be on the medial leaf of the broad ligament.  The peritoneum above the ureter was incised and stretched and the infundibulopelvic ligament was skeletonized, cauterized and cut.  The posterior peritoneum was taken down to the level of the KOH ring.  The anterior peritoneum was also taken down.  The bladder flap was created to the level of the KOH ring.  The  uterine artery on the right side was skeletonized, cauterized and cut in the normal manner.  A similar procedure was performed on the left.  The colpotomy was made and the uterus, cervix, bilateral ovaries and tubes were amputated and delivered through the vagina.  Pedicles were inspected and excellent hemostasis was achieved.    Frozen section returned with a diagnosis of right ovarian fibrothecoma and endometrial polyps.    The colpotomy at the vaginal cuff was closed with Vicryl on a CT1 needle in a running manner.  Irrigation was used and excellent hemostasis was achieved.  At this point in the procedure was completed.  Robotic instruments were removed under direct visulaization.  The robot was undocked. The 10 mm ports were closed with Vicryl on a UR-5 needle and the fascia was closed with 0 Vicryl on a UR-5 needle.  The skin was closed with 4-0 Vicryl in a subcuticular manner.  was also applied.  Sponge, lap and needle counts correct x 2.  The patient was taken to the recovery room in stable condition.  The vagina was swabbed.  A right laceration at the hymenal tag  Was repaired with placement of two interrupted sutures. Instrument and needle counts were correct x  3.   The patient was transferred to the recovery room in stable  condition.  Estimated Blood Loss:  less than 50 mL            Specimens: PATHOLOGY Uterus cervix ovaries tubes washings         Complications:  None; patient tolerated the procedure well.         Disposition: PACU - hemodynamically stable.

## 2017-04-03 NOTE — Progress Notes (Signed)
Patient vomited three times after she arrived to 5 W.  She also had an episode dizziness/syncopy during vomiting episode of vomiting.  Patient stated she usually has problem with nausea and vomiting up to ten hours  After surgery.  Information given to MD on evening shift about this.

## 2017-04-04 ENCOUNTER — Encounter (HOSPITAL_COMMUNITY): Payer: Self-pay | Admitting: Gynecologic Oncology

## 2017-04-04 DIAGNOSIS — D270 Benign neoplasm of right ovary: Secondary | ICD-10-CM | POA: Diagnosis not present

## 2017-04-04 LAB — BASIC METABOLIC PANEL
ANION GAP: 7 (ref 5–15)
BUN: 12 mg/dL (ref 6–20)
CHLORIDE: 103 mmol/L (ref 101–111)
CO2: 26 mmol/L (ref 22–32)
CREATININE: 0.6 mg/dL (ref 0.44–1.00)
Calcium: 8.7 mg/dL — ABNORMAL LOW (ref 8.9–10.3)
GFR calc non Af Amer: >60 mL/min (ref >60)
Glucose, Bld: 126 mg/dL — ABNORMAL HIGH (ref 65–99)
POTASSIUM: 4.2 mmol/L (ref 3.5–5.1)
Sodium: 136 mmol/L (ref 135–145)

## 2017-04-04 LAB — CBC
HEMATOCRIT: 29.8 % — AB (ref 36.0–46.0)
HEMOGLOBIN: 9.1 g/dL — AB (ref 12.0–15.0)
MCH: 21.8 pg — ABNORMAL LOW (ref 26.0–34.0)
MCHC: 30.5 g/dL (ref 30.0–36.0)
MCV: 71.5 fL — ABNORMAL LOW (ref 78.0–100.0)
Platelets: 412 10*3/uL — ABNORMAL HIGH (ref 150–400)
RBC: 4.17 MIL/uL (ref 3.87–5.11)
RDW: 17.3 % — ABNORMAL HIGH (ref 11.5–15.5)
WBC: 15.3 10*3/uL — ABNORMAL HIGH (ref 4.0–10.5)

## 2017-04-04 MED ORDER — ONDANSETRON HCL 4 MG PO TABS: 4.0000 mg | ORAL_TABLET | Freq: Three times a day (TID) | ORAL | 0 refills | Status: AC | PRN

## 2017-04-04 MED ORDER — HYDROCODONE-ACETAMINOPHEN 5-325 MG PO TABS: 1.0000 | ORAL_TABLET | Freq: Four times a day (QID) | ORAL | 0 refills | Status: DC | PRN

## 2017-04-04 NOTE — Progress Notes (Signed)
Pt was discharged home today. Instructions were reviewed with patient, and questions were answered. Pt was taken to main entrance via wheelchair by NT.  

## 2017-04-04 NOTE — Discharge Summary (Signed)
Physician Discharge Summary  Patient ID: Christina Hines MRN: [id number] DOB/AGE: May 28, 1955 61 y.o.  Admit date: 04/03/2017 Discharge date: 04/04/2017  Admission Diagnoses: Pelvic mass in female  Discharge Diagnoses:  Principal Problem:   Pelvic mass in female   Discharged Condition:  The patient is in good condition and stable for discharge.   Hospital Course: On 04/03/2017, the patient underwent the following: Procedure(s): XI ROBOTIC ASSISTED TOTAL HYSTERECTOMY WITH BILATERAL SALPINGO OOPHORECTOMY.  The postoperative course was uneventful.  She was discharged to home on postoperative day 1 tolerating a regular diet, minimal pain, ambulating, voiding.  Consults: None  Significant Diagnostic Studies: None  Treatments: surgery: see above  Discharge Exam: Blood pressure (!) 123/54, pulse 72, temperature 98.7 F (37.1 C), temperature source Oral, resp. rate 18, height 5' 5.5" (1.664 m), weight 196 lb (88.9 kg), last menstrual period 03/31/2017, SpO2 96 %. General appearance: alert, cooperative and no distress Resp: clear to auscultation bilaterally Cardio: regular rate and rhythm, S1, S2 normal, no murmur, click, rub or gallop GI: soft, non-tender; bowel sounds normal; no masses,  no organomegaly Extremities: extremities normal, atraumatic, no cyanosis or edema Incision/Wound: Lap sites to the abdomen with dermabond without erythema or drainage  Disposition: Home Discharge Instructions    Call MD for:  difficulty breathing, headache or visual disturbances   Complete by:  As directed    Call MD for:  extreme fatigue   Complete by:  As directed    Call MD for:  hives   Complete by:  As directed    Call MD for:  persistant dizziness or light-headedness   Complete by:  As directed    Call MD for:  persistant nausea and vomiting   Complete by:  As directed    Call MD for:  redness, tenderness, or signs of infection (pain, swelling, redness, odor or green/yellow discharge  around incision site)   Complete by:  As directed    Call MD for:  severe uncontrolled pain   Complete by:  As directed    Call MD for:  temperature >100.4   Complete by:  As directed    Diet - low sodium heart healthy   Complete by:  As directed    Driving Restrictions   Complete by:  As directed    No driving for 1 week.  Do not take narcotics and drive.   Increase activity slowly   Complete by:  As directed    Lifting restrictions   Complete by:  As directed    No lifting greater than 10 lbs.   Sexual Activity Restrictions   Complete by:  As directed    No sexual activity, nothing in the vagina, for 8 weeks.     Allergies as of 04/04/2017      Reactions   Codeine Nausea And Vomiting      Medication List    STOP taking these medications   levonorgestrel 20 MCG/24HR IUD Commonly known as:  MIRENA     TAKE these medications   b complex vitamins tablet Take 1 tablet daily by mouth.   CALCIUM PO Take 300 mg by mouth daily.   HYDROcodone-acetaminophen 5-325 MG tablet Commonly known as:  NORCO/VICODIN Take 1 tablet every 6 (six) hours as needed by mouth for moderate pain.   losartan 50 MG tablet Commonly known as:  COZAAR losartan 50 mg tablet   ondansetron 4 MG tablet Commonly known as:  ZOFRAN Take 1 tablet (4 mg total) every 8 (eight) hours as  needed by mouth for nausea or vomiting.   Vitamin D3 2000 units Tabs Take 1 tablet by mouth daily.        Greater than thirty minutes were spend for face to face discharge instructions and discharge orders/summary in EPIC.   Signed: Raeleigh Guinn DEAL 04/04/2017, 11:23 AM

## 2017-04-04 NOTE — Discharge Instructions (Addendum)
04/04/2017  Return to work: 4-6 weeks if applicable  Activity: 1. Be up and out of the bed during the day.  Take a nap if needed.  You may walk up steps but be careful and use the hand rail.  Stair climbing will tire you more than you think, you may need to stop part way and rest.   2. No lifting or straining for 6 weeks.  3. No driving for 1 week(s).  Do not drive if you are taking narcotic pain medicine.  4. Shower daily.  Use soap and water on your incision and pat dry; don't rub.  No tub baths until cleared by your surgeon.   5. No sexual activity and nothing in the vagina for 8 weeks.  6. You may experience a small amount of clear drainage from your incisions, which is normal.  If the drainage persists or increases, please call the office.  7. You may experience vaginal spotting after surgery or around the 6-8 week mark from surgery when the stitches at the top of the vagina begin to dissolve.  The spotting is normal but if you experience heavy bleeding, call our office.  8. Take Tylenol or ibuprofen first for pain and only use Hydrocodone/APAP for severe pain not relieved by the Tylenol or Ibuprofen.  Monitor your Tylenol intake to a max of 4,000 mg a day since Hydrocodone/APAP has Tylenol in it as well.  Diet: 1. Low sodium Heart Healthy Diet is recommended.  2. It is safe to use a laxative, such as Miralax or Colace, if you have difficulty moving your bowels. You can take Sennakot at bedtime every evening to keep bowel movements regular and to prevent constipation.    Wound Care: 1. Keep clean and dry.  Shower daily.  Reasons to call the Doctor:  Fever - Oral temperature greater than 100.4 degrees Fahrenheit  Foul-smelling vaginal discharge  Difficulty urinating  Nausea and vomiting  Increased pain at the site of the incision that is unrelieved with pain medicine.  Difficulty breathing with or without chest pain  New calf pain especially if only on one  side  Sudden, continuing increased vaginal bleeding with or without clots.   Contacts: For questions or concerns you should contact:  Dr. Janie Morning at [place], NP at [phone]  After Hours: call [phone] and have the GYN Oncologist paged/contacted  Acetaminophen; Hydrocodone tablets or capsules What is this medicine? ACETAMINOPHEN; HYDROCODONE (a set a MEE noe fen; hye droe KOE done) is a pain reliever. It is used to treat moderate to severe pain. This medicine may be used for other purposes; ask your health care provider or pharmacist if you have questions. COMMON BRAND NAME(S): Anexsia, Bancap HC, Ceta-Plus, Co-Gesic, Comfortpak, Dolagesic, [employer], DuoCet, Hydrocet, Hydrogesic, [place], Lorcet HD, Lorcet Plus, Lortab, Margesic H, Maxidone, [place], Polygesic, [place], [place], [place], Vicodin, Vicodin ES, Vicodin HP, Charlane Ferretti What should I tell my health care provider before I take this medicine? They need to know if you have any of these conditions: -brain tumor -Crohn's disease, inflammatory bowel disease, or ulcerative colitis -drug abuse or addiction -head injury -heart or circulation problems -if you often drink alcohol -kidney disease or problems going to the bathroom -liver disease -lung disease, asthma, or breathing problems -an unusual or allergic reaction to acetaminophen, hydrocodone, other opioid analgesics, other medicines, foods, dyes, or preservatives -pregnant or trying to get pregnant -breast-feeding How should I use this medicine? Take this medicine by mouth with  a glass of water. Follow the directions on the prescription label. You can take it with or without food. If it upsets your stomach, take it with food. Do not take your medicine more often than directed. A special MedGuide will be given to you by the pharmacist with each prescription and refill. Be sure to read this information carefully each time. Talk to  your pediatrician regarding the use of this medicine in children. Special care may be needed. Overdosage: If you think you have taken too much of this medicine contact a poison control center or emergency room at once. NOTE: This medicine is only for you. Do not share this medicine with others. What if I miss a dose? If you miss a dose, take it as soon as you can. If it is almost time for your next dose, take only that dose. Do not take double or extra doses. What may interact with this medicine? This medicine may interact with the following medications: -alcohol -antiviral medicines for HIV or AIDS -atropine -antihistamines for allergy, cough and cold -certain antibiotics like erythromycin, clarithromycin -certain medicines for anxiety or sleep -certain medicines for bladder problems like oxybutynin, tolterodine -certain medicines for depression like amitriptyline, fluoxetine, sertraline -certain medicines for fungal infections like ketoconazole and itraconazole -certain medicines for Parkinson's disease like benztropine, trihexyphenidyl -certain medicines for seizures like carbamazepine, phenobarbital, phenytoin, primidone -certain medicines for stomach problems like dicyclomine, hyoscyamine -certain medicines for travel sickness like scopolamine -general anesthetics like halothane, isoflurane, methoxyflurane, propofol -ipratropium -local anesthetics like lidocaine, pramoxine, tetracaine -MAOIs like Carbex, Eldepryl, Marplan, Nardil, and Parnate -medicines that relax muscles for surgery -other medicines with acetaminophen -other narcotic medicines for pain or cough -phenothiazines like chlorpromazine, mesoridazine, prochlorperazine, thioridazine -rifampin This list may not describe all possible interactions. Give your health care provider a list of all the medicines, herbs, non-prescription drugs, or dietary supplements you use. Also tell them if you smoke, drink alcohol, or use  illegal drugs. Some items may interact with your medicine. What should I watch for while using this medicine? Tell your doctor or health care professional if your pain does not go away, if it gets worse, or if you have new or a different type of pain. You may develop tolerance to the medicine. Tolerance means that you will need a higher dose of the medicine for pain relief. Tolerance is normal and is expected if you take the medicine for a long time. Do not suddenly stop taking your medicine because you may develop a severe reaction. Your body becomes used to the medicine. This does NOT mean you are addicted. Addiction is a behavior related to getting and using a drug for a non-medical reason. If you have pain, you have a medical reason to take pain medicine. Your doctor will tell you how much medicine to take. If your doctor wants you to stop the medicine, the dose will be slowly lowered over time to avoid any side effects. There are different types of narcotic medicines (opiates). If you take more than one type at the same time or if you are taking another medicine that also causes drowsiness, you may have more side effects. Give your health care provider a list of all medicines you use. Your doctor will tell you how much medicine to take. Do not take more medicine than directed. Call emergency for help if you have problems breathing or unusual sleepiness. Do not take other medicines that contain acetaminophen with this medicine. Always read labels carefully. If you  have questions, ask your doctor or pharmacist. If you take too much acetaminophen get medical help right away. Too much acetaminophen can be very dangerous and cause liver damage. Even if you do not have symptoms, it is important to get help right away. You may get drowsy or dizzy. Do not drive, use machinery, or do anything that needs mental alertness until you know how this medicine affects you. Do not stand or sit up quickly, especially if  you are an older patient. This reduces the risk of dizzy or fainting spells. Alcohol may interfere with the effect of this medicine. Avoid alcoholic drinks. The medicine will cause constipation. Try to have a bowel movement at least every 2 to 3 days. If you do not have a bowel movement for 3 days, call your doctor or health care professional. Your mouth may get dry. Chewing sugarless gum or sucking hard candy, and drinking plenty of water may help. Contact your doctor if the problem does not go away or is severe. What side effects may I notice from receiving this medicine? Side effects that you should report to your doctor or health care professional as soon as possible: -allergic reactions like skin rash, itching or hives, swelling of the face, lips, or tongue -breathing problems -confusion -redness, blistering, peeling or loosening of the skin, including inside the mouth -signs and symptoms of low blood pressure like dizziness; feeling faint or lightheaded, falls; unusually weak or tired -trouble passing urine or change in the amount of urine -yellowing of the eyes or skin Side effects that usually do not require medical attention (report to your doctor or health care professional if they continue or are bothersome): -constipation -dry mouth -nausea, vomiting -tiredness This list may not describe all possible side effects. Call your doctor for medical advice about side effects. You may report side effects to FDA at 1-800-FDA-1088. Where should I keep my medicine? Keep out of the reach of children. This medicine can be abused. Keep your medicine in a safe place to protect it from theft. Do not share this medicine with anyone. Selling or giving away this medicine is dangerous and against the law. This medicine may cause accidental overdose and death if it taken by other adults, children, or pets. Mix any unused medicine with a substance like cat litter or coffee grounds. Then throw the medicine  away in a sealed container like a sealed bag or a coffee can with a lid. Do not use the medicine after the expiration date. Store at room temperature between 15 and 30 degrees C (59 and 86 degrees F). NOTE: This sheet is a summary. It may not cover all possible information. If you have questions about this medicine, talk to your doctor, pharmacist, or health care provider.  2018 Elsevier/Gold Standard (2015-01-27 10:02:16)  Ondansetron tablets What is this medicine? ONDANSETRON (on DAN se tron) is used to treat nausea and vomiting caused by chemotherapy. It is also used to prevent or treat nausea and vomiting after surgery. This medicine may be used for other purposes; ask your health care provider or pharmacist if you have questions. COMMON BRAND NAME(S): Zofran What should I tell my health care provider before I take this medicine? They need to know if you have any of these conditions: -heart disease -history of irregular heartbeat -liver disease -low levels of magnesium or potassium in the blood -an unusual or allergic reaction to ondansetron, granisetron, other medicines, foods, dyes, or preservatives -pregnant or trying to get pregnant -breast-feeding  How should I use this medicine? Take this medicine by mouth with a glass of water. Follow the directions on your prescription label. Take your doses at regular intervals. Do not take your medicine more often than directed. Talk to your pediatrician regarding the use of this medicine in children. Special care may be needed. Overdosage: If you think you have taken too much of this medicine contact a poison control center or emergency room at once. NOTE: This medicine is only for you. Do not share this medicine with others. What if I miss a dose? If you miss a dose, take it as soon as you can. If it is almost time for your next dose, take only that dose. Do not take double or extra doses. What may interact with this medicine? Do not take  this medicine with any of the following medications: -apomorphine -certain medicines for fungal infections like fluconazole, itraconazole, ketoconazole, posaconazole, voriconazole -cisapride -dofetilide -dronedarone -pimozide -thioridazine -ziprasidone This medicine may also interact with the following medications: -carbamazepine -certain medicines for depression, anxiety, or psychotic disturbances -fentanyl -linezolid -MAOIs like Carbex, Eldepryl, Marplan, Nardil, and Parnate -methylene blue (injected into a vein) -other medicines that prolong the QT interval (cause an abnormal heart rhythm) -phenytoin -rifampicin -tramadol This list may not describe all possible interactions. Give your health care provider a list of all the medicines, herbs, non-prescription drugs, or dietary supplements you use. Also tell them if you smoke, drink alcohol, or use illegal drugs. Some items may interact with your medicine. What should I watch for while using this medicine? Check with your doctor or health care professional right away if you have any sign of an allergic reaction. What side effects may I notice from receiving this medicine? Side effects that you should report to your doctor or health care professional as soon as possible: -allergic reactions like skin rash, itching or hives, swelling of the face, lips or tongue -breathing problems -confusion -dizziness -fast or irregular heartbeat -feeling faint or lightheaded, falls -fever and chills -loss of balance or coordination -seizures -sweating -swelling of the hands or feet -tightness in the chest -tremors -unusually weak or tired Side effects that usually do not require medical attention (report to your doctor or health care professional if they continue or are bothersome): -constipation or diarrhea -headache This list may not describe all possible side effects. Call your doctor for medical advice about side effects. You may report  side effects to FDA at 1-800-FDA-1088. Where should I keep my medicine? Keep out of the reach of children. Store between 2 and 30 degrees C (36 and 86 degrees F). Throw away any unused medicine after the expiration date. NOTE: This sheet is a summary. It may not cover all possible information. If you have questions about this medicine, talk to your doctor, pharmacist, or health care provider.  2018 Elsevier/Gold Standard (2013-02-10 16:27:45)

## 2017-04-09 ENCOUNTER — Other Ambulatory Visit: Payer: Self-pay | Admitting: Gynecologic Oncology

## 2017-04-09 DIAGNOSIS — N289 Disorder of kidney and ureter, unspecified: Secondary | ICD-10-CM

## 2017-04-09 DIAGNOSIS — N2889 Other specified disorders of kidney and ureter: Secondary | ICD-10-CM

## 2017-04-15 ENCOUNTER — Encounter: Payer: Self-pay | Admitting: Gynecologic Oncology

## 2017-04-15 NOTE — Progress Notes (Signed)
Authorization approved from [facility] after initial denial.  She had the CT scan at [facility] and it showed a renal lesion and recommended a MRI. I went ahead and called and got it approved. Auth# [id number] valid beginning 04/11/17-07/10/17.

## 2017-04-18 ENCOUNTER — Ambulatory Visit (HOSPITAL_COMMUNITY)
Admission: RE | Admit: 2017-04-18 | Discharge: 2017-04-18 | Disposition: A | Discharge status: Home / Self Care | Payer: Managed Care, Other (non HMO) | Source: Ambulatory Visit | Attending: Gynecologic Oncology | Admitting: Gynecologic Oncology

## 2017-04-18 DIAGNOSIS — N289 Disorder of kidney and ureter, unspecified: Secondary | ICD-10-CM | POA: Diagnosis present

## 2017-04-18 DIAGNOSIS — K760 Fatty (change of) liver, not elsewhere classified: Secondary | ICD-10-CM | POA: Insufficient documentation

## 2017-04-18 DIAGNOSIS — N2889 Other specified disorders of kidney and ureter: Secondary | ICD-10-CM

## 2017-04-18 DIAGNOSIS — K8020 Calculus of gallbladder without cholecystitis without obstruction: Secondary | ICD-10-CM | POA: Insufficient documentation

## 2017-04-18 DIAGNOSIS — K439 Ventral hernia without obstruction or gangrene: Secondary | ICD-10-CM | POA: Insufficient documentation

## 2017-04-18 DIAGNOSIS — N281 Cyst of kidney, acquired: Secondary | ICD-10-CM | POA: Diagnosis not present

## 2017-04-18 MED ORDER — GADOBENATE DIMEGLUMINE 529 MG/ML IV SOLN
20.0000 mL | Freq: Once | INTRAVENOUS | Status: AC | PRN
  Administered 2017-04-18: 20 mL via INTRAVENOUS

## 2017-05-08 ENCOUNTER — Encounter: Payer: Self-pay | Admitting: Gynecologic Oncology

## 2017-05-08 ENCOUNTER — Ambulatory Visit: Payer: Managed Care, Other (non HMO) | Attending: Gynecologic Oncology | Admitting: Gynecologic Oncology

## 2017-05-08 ENCOUNTER — Ambulatory Visit (HOSPITAL_BASED_OUTPATIENT_CLINIC_OR_DEPARTMENT_OTHER): Payer: Managed Care, Other (non HMO)

## 2017-05-08 VITALS — BP 145/71 | HR 81 | Temp 97.9°F | Resp 20 | Ht 66.0 in | Wt 192.1 lb

## 2017-05-08 DIAGNOSIS — Z800 Family history of malignant neoplasm of digestive organs: Secondary | ICD-10-CM | POA: Insufficient documentation

## 2017-05-08 DIAGNOSIS — N839 Noninflammatory disorder of ovary, fallopian tube and broad ligament, unspecified: Secondary | ICD-10-CM

## 2017-05-08 DIAGNOSIS — Z79899 Other long term (current) drug therapy: Secondary | ICD-10-CM | POA: Diagnosis not present

## 2017-05-08 DIAGNOSIS — K439 Ventral hernia without obstruction or gangrene: Secondary | ICD-10-CM | POA: Insufficient documentation

## 2017-05-08 DIAGNOSIS — Z9889 Other specified postprocedural states: Secondary | ICD-10-CM | POA: Diagnosis not present

## 2017-05-08 DIAGNOSIS — Z96653 Presence of artificial knee joint, bilateral: Secondary | ICD-10-CM | POA: Insufficient documentation

## 2017-05-08 DIAGNOSIS — K8020 Calculus of gallbladder without cholecystitis without obstruction: Secondary | ICD-10-CM | POA: Diagnosis not present

## 2017-05-08 DIAGNOSIS — K760 Fatty (change of) liver, not elsewhere classified: Secondary | ICD-10-CM | POA: Insufficient documentation

## 2017-05-08 DIAGNOSIS — Z79891 Long term (current) use of opiate analgesic: Secondary | ICD-10-CM | POA: Diagnosis not present

## 2017-05-08 DIAGNOSIS — N281 Cyst of kidney, acquired: Secondary | ICD-10-CM | POA: Insufficient documentation

## 2017-05-08 DIAGNOSIS — I10 Essential (primary) hypertension: Secondary | ICD-10-CM | POA: Diagnosis not present

## 2017-05-08 DIAGNOSIS — N838 Other noninflammatory disorders of ovary, fallopian tube and broad ligament: Secondary | ICD-10-CM

## 2017-05-08 DIAGNOSIS — N946 Dysmenorrhea, unspecified: Secondary | ICD-10-CM | POA: Insufficient documentation

## 2017-05-08 DIAGNOSIS — Z885 Allergy status to narcotic agent status: Secondary | ICD-10-CM | POA: Diagnosis not present

## 2017-05-08 DIAGNOSIS — Z90710 Acquired absence of both cervix and uterus: Secondary | ICD-10-CM | POA: Insufficient documentation

## 2017-05-08 DIAGNOSIS — Z87442 Personal history of urinary calculi: Secondary | ICD-10-CM | POA: Insufficient documentation

## 2017-05-08 NOTE — Patient Instructions (Signed)
We will draw an Inhibin B today and call you with the results.  Please call for any questions or concerns.

## 2017-05-08 NOTE — Progress Notes (Signed)
GYN ONCOLOGY OFFICE VISIT   Referring Physician  Dr. Vanice Sarah 61 y.o. female  CC:  Chief Complaint  Patient presents with  . Ovarian mass, right  Postop check  Assessment/Plan:  Ms. Christina Hines  is a 61 y.o.  year old with a solid ovarian (Right) mass, mildly elevated Inhibin B and endometrial hyperplasia who underwent hysterectomy bilateral salpingo-oophorectomy April 03, 2017.  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   Will repeat inhibin in the preoperative elevated level.   Christina Hines states that her mammogram earlier this year was within normal limits.  Renal lesion.   An indeterminate right upper pole lesion was noted on CT scan and MRIs consistent with renal cysts  Follow-up with Dr. Deatra Ina as scheduled   HPI: Christina Hines is a 61 year old woman who is seen in consultation at the request of Dr Deatra Ina for ovarian mass and elevated tumor markers in the setting of endometrial hyperplasia.  The patient reported to her OBGYN at age 61 that she had never been amenorrhea/gone through menopause. This prompted labs to be drawn which showed a low FSH. US pelvis on 01/01/17 showed a globular uterus with thickened endometrium. The right adnexa included a mass measuring 5x4.2x5.4cm.   Endometrial biopsy on 01/01/17 showed benign simple hyperplasia without atypia.  Inhibin B on 01/01/17 was mildly elevated at 17.4 (upper limit is 16.9). Repeat Inhibin B on 02/06/17 showed It to have increased to 21.4.  A mirena IUD was placed on 02/06/17 to provide progesterone to the endometrium in the setting of endogenous unopposed estrogen and hyperplasia.  The patient is otherwise fairly healthy. She has had 2 cesarean sections and a tubal ligation. She has a remote history (20+ years ago) of high grade cervical dysplasia requiring CKC. She has had normal paps since that time.  Preoperative CT scan to evaluate for occult metastatic disease  notable for an indeterminate right upper pole renal cystic lesion measuring 1.5 cm renal lesion.  MRI recommended.  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 No evidence of renal neoplasm or hydronephrosis. Cholelithiasis.  No radiographic evidence of cholecystitis. Mild hepatic steatosis. Tiny paraumbilical ventral hernia containing only fat.  Current Meds:  Outpatient Encounter Medications as of 05/08/2017  Medication Sig  . b complex vitamins tablet Take 1 tablet daily by mouth.  Marland Kitchen CALCIUM PO Take 300 mg by mouth daily.  . Cholecalciferol (VITAMIN D3) 2000 units TABS Take 1 tablet by mouth daily.  Marland Kitchen losartan (COZAAR) 50 MG tablet losartan 50 mg tablet  . ondansetron (ZOFRAN) 4 MG tablet Take 1 tablet (4 mg total) every 8 (eight) hours as needed by mouth for nausea or vomiting.  . [DISCONTINUED] HYDROcodone-acetaminophen (NORCO/VICODIN) 5-325 MG tablet Take 1 tablet every 6 (six) hours as needed by mouth for moderate pain.   No facility-administered encounter medications on file as of 05/08/2017.     Allergy:  Allergies  Allergen Reactions  . Codeine Nausea And Vomiting    Social Hx:   Social History   Socioeconomic History  . Marital status: Married    Spouse name: Not on file  . Number of children: Not on file  . Years of  education: Not on file  . Highest education level: Not on file  Social Needs  . Financial resource strain: Not on file  . Food insecurity - worry: Not on file  . Food insecurity - inability: Not on file  . Transportation needs - medical:  Not on file  . Transportation needs - non-medical: Not on file  Occupational History  . Not on file  Tobacco Use  . Smoking status: Never Smoker  . Smokeless tobacco: Never Used  Substance and Sexual Activity  . Alcohol use: No  . Drug use: Not on file  . Sexual activity: Yes    Birth control/protection: Surgical, Post-menopausal, IUD  Other Topics Concern  . Not on file  Social History Narrative  . Not on file    Past Surgical Hx:  Past Surgical History:  Procedure Laterality Date  . CARPAL TUNNEL RELEASE  05/20/2012  . CESAREAN SECTION     x 2  . REPLACEMENT TOTAL KNEE BILATERAL Bilateral 05/20/2014  . ROBOTIC ASSISTED TOTAL HYSTERECTOMY WITH BILATERAL SALPINGO OOPHERECTOMY Bilateral 04/03/2017   Procedure: XI ROBOTIC ASSISTED TOTAL HYSTERECTOMY WITH BILATERAL SALPINGO OOPHORECTOMY;  Surgeon: Janie Morning, MD;  Location: [facility];  Service: Gynecology;  Laterality: Bilateral;  . TUBAL LIGATION Bilateral 05/20/1985    Past Medical Hx:  Past Medical History:  Diagnosis Date  . Arthritis   . Dysmenorrhea   . Dysrhythmia   . History of kidney stones   . Hypertension     Past Gynecological History:  C/s x 2 No LMP recorded.  Family Hx:  Family History  Problem Relation Age of Onset  . Liver cancer Mother     Review of Systems:  Constitutional  Feels well,   no fever or chills Pulmonary  No cough or wheeze.  Gastro Intestinal  No nausea, vomitting, or diarrhoea. No bright red blood per rectum, no abdominal pain, change in bowel movement, or constipation.  Genito Urinary  No frequency, no incontinence reports black vaginal discharge  musculo Skeletal  No myalgia, arthralgia, joint swelling or pain  Neurologic  No weakness, numbness, change in gait,  Psychology  No depression, anxiety, insomnia.   Vitals:  Blood pressure (!) 145/71, pulse 81, temperature 97.9 F (36.6 C), temperature source Oral, resp. rate 20, height 5\' 6"  (1.676 m), weight 192 lb 1.6 oz  (87.1 kg), SpO2 100 %.  Physical Exam: WD in NAD Neck  Supple NROM, without any enlargements.  Psychiatry  Alert and oriented to person, place, and time  Abdomen  Normoactive bowel sounds, abdomen soft, non-tender and nonobese, no erythema masses or tenderness at laparoscopic incision sites.  Back No CVA tenderness Genito Urinary  Vulva/vagina: Normal external female genitalia.   No lesions. No discharge or bleeding.  Bladder/urethra:  No lesions or masses, well supported bladder  Vagina: Granulation tissue notable at the vaginal cuff on the left lateral aspect cuff is intact.  Granulation tissue was removed with a small amount of bleeding.  Silver nitrate was applied Rectal  deferred Extremities  No bilateral cyanosis, clubbing or edema.   Janie Morning, MD  05/08/2017, 11:57 AM

## 2017-05-15 LAB — INHIBIN B: Inhibin B: <7 pg/mL (ref 0.0–16.9)

## 2017-05-16 ENCOUNTER — Telehealth: Payer: Self-pay | Admitting: Gynecologic Oncology

## 2017-05-16 ENCOUNTER — Ambulatory Visit: Payer: 59 | Admitting: Gynecologic Oncology

## 2017-05-16 NOTE — Telephone Encounter (Signed)
Patient informed of Inhibin B results.  All questions answered.  She was reporting intermittent leg cramps that are improving after eating bananas.  Advised to continue to monitor and contact her PCP if continues.  Advised to call for any needs or concerns.

## 2019-01-30 IMAGING — MR MR ABDOMEN WO/W CM
11 of 19 series · 22 of 48 positions shown · IV contrast (multihance)
Comparison: None.

CLINICAL DATA: Indeterminate cystic lesion of right kidney on
previous outside CT.

EXAM:
MRI ABDOMEN WITHOUT AND WITH CONTRAST
TECHNIQUE: Multiplanar multisequence MR imaging of the abdomen was performed
both before and after the administration of intravenous contrast.
CONTRAST:  20mL MULTIHANCE GADOBENATE DIMEGLUMINE 529 MG/ML IV SOLN

[Series 4: DWI b500 · axial · 6.0mm · 1.56mm/px · z∈[-30,+251]mm · 3 of 74 slices shown]
[im 1/74]
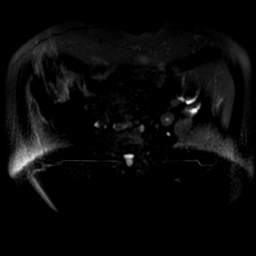
[im 37/74]
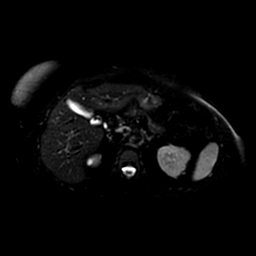
[im 74/74]
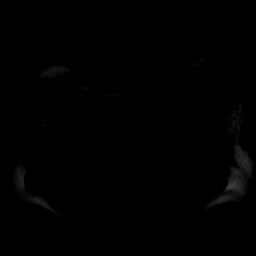

[Series 5: T2 fat-sat · axial · 5.0mm · 0.78mm/px · z∈[-32,+253]mm · 2 of 58 slices shown]
[im 1/58]
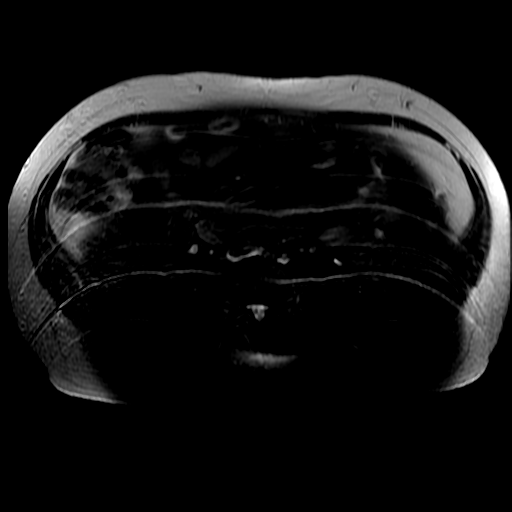
[im 58/58]
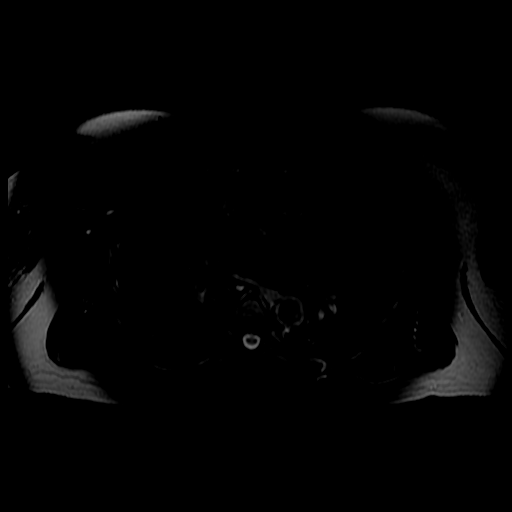

[Series 7: ax dualecho · axial · 5.0mm · 0.78mm/px · z∈[-34,+231]mm · 3 of 108 slices shown]
[im 1/108]
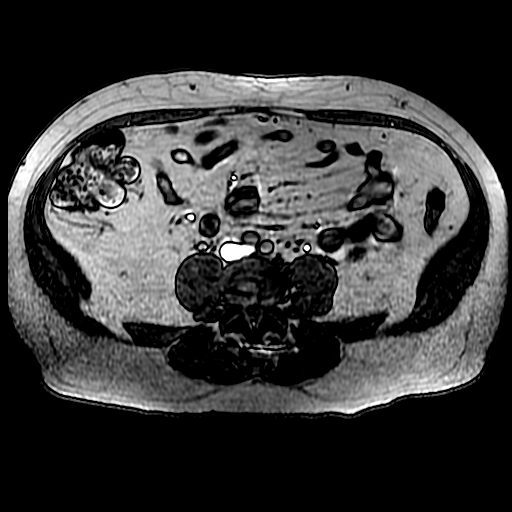
[im 54/108]
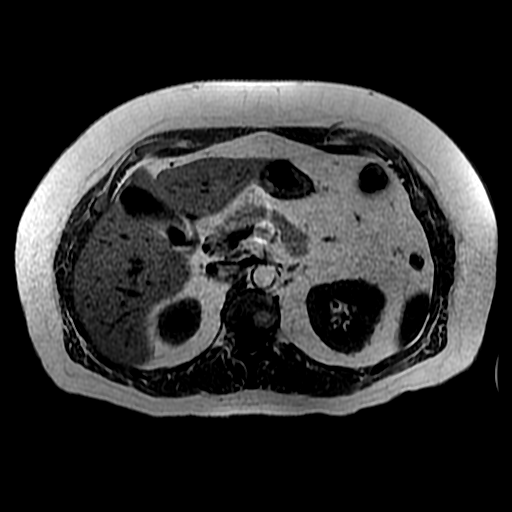
[im 108/108]
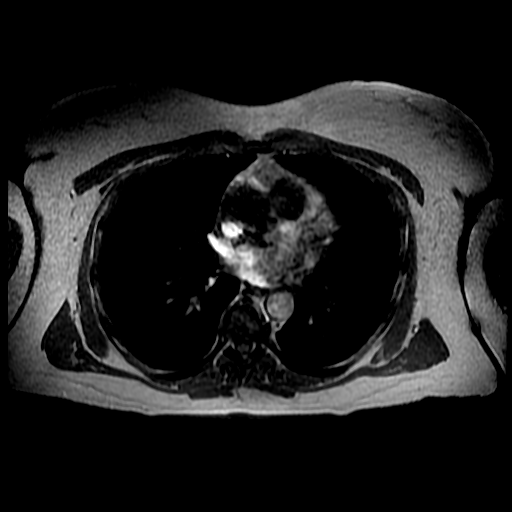

[Series 8: T2 · axial · 5.0mm · 0.78mm/px · 1 of 54 slices shown (1 of 2)]
[im 1/54]
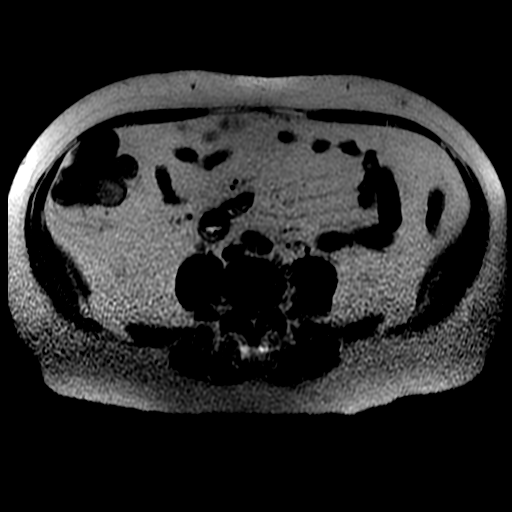

[Series 10: T2 · coronal · 5.0mm · 0.78mm/px · 1 of 41 slices shown (2 of 2)]
[im 1/41]
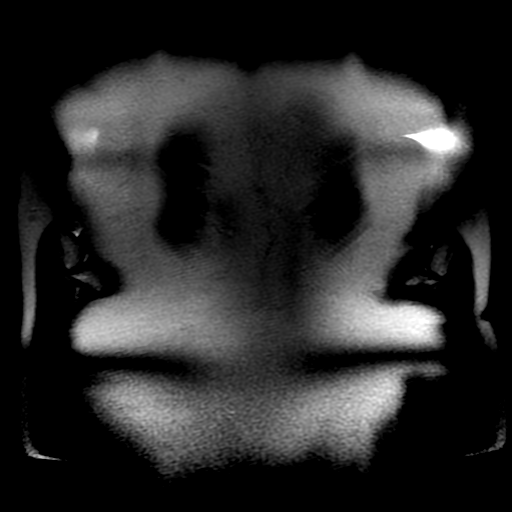

[Series 11: bSSFP · axial · 5.0mm · 0.78mm/px · 1 of 54 slices shown]
[im 1/54]
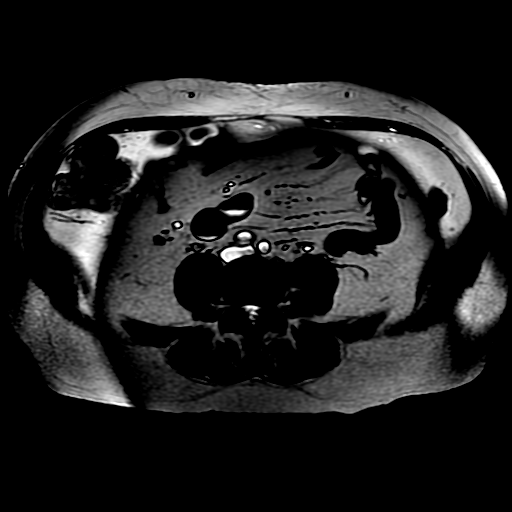

[Series 400: DWI · axial · 6.0mm · 1.56mm/px · 1 of 37 slices shown]
[im 1/37]
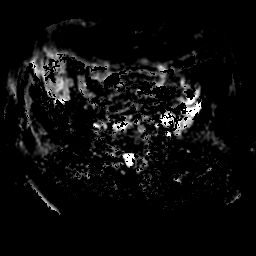

[Series 1200: T1 dynamic · axial · 5.0mm · 0.78mm/px · z∈[-38,+239]mm · 3 of 112 slices shown (1 of 4)]
[im 1/112]
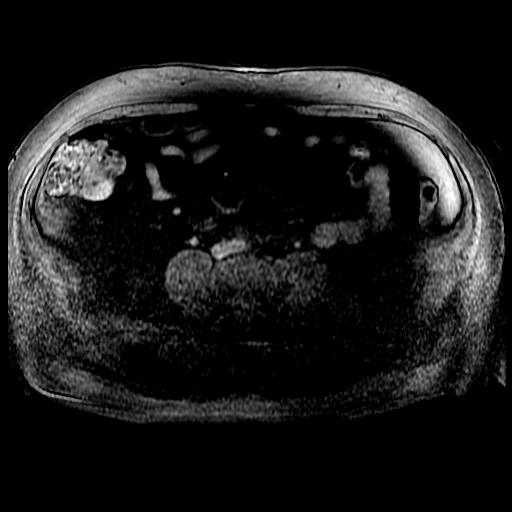
[im 56/112]
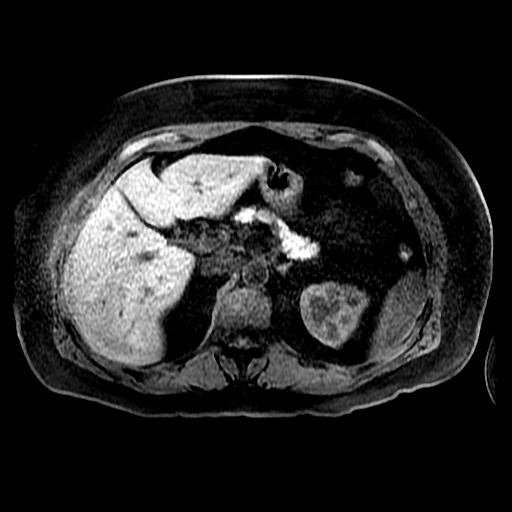
[im 112/112]
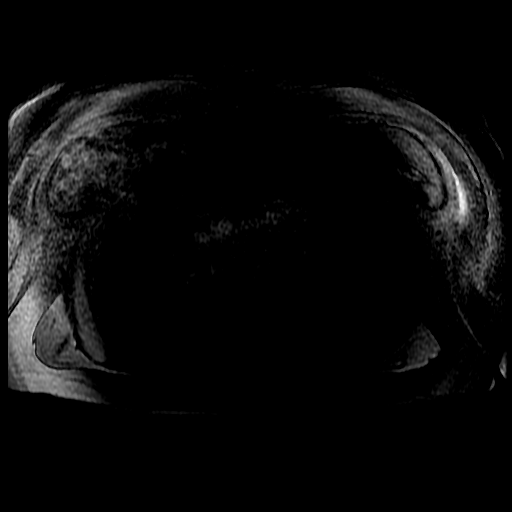

[Series 1300: T1 dynamic · axial · 5.0mm · 0.78mm/px · z∈[-46,+231]mm · 3 of 112 slices shown (2 of 4)]
[im 1/112]
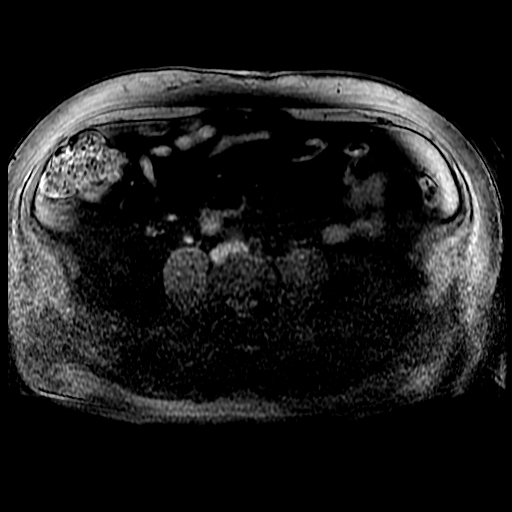
[im 56/112]
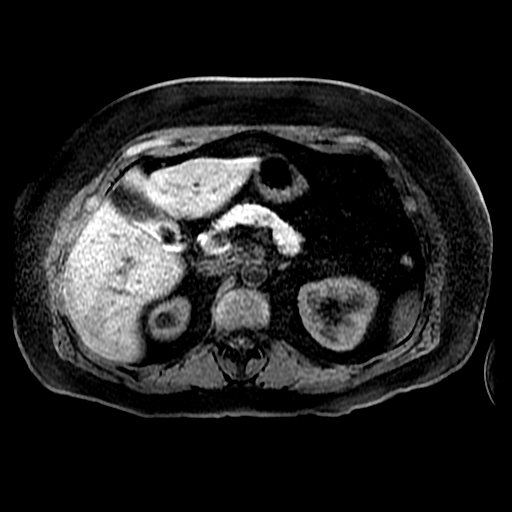
[im 112/112]
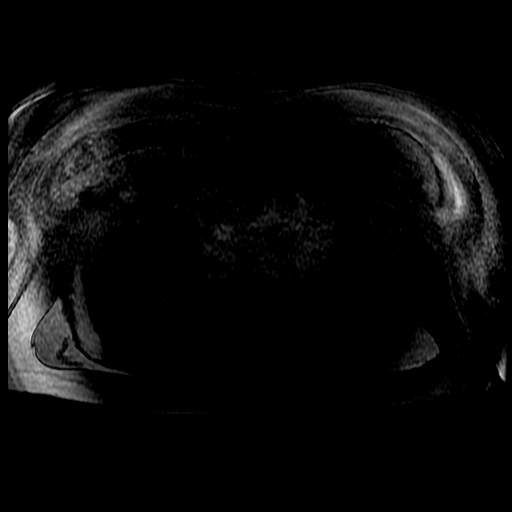

[Series 1301: T1 dynamic · axial · 5.0mm · 0.78mm/px · z∈[-46,+231]mm · 3 of 112 slices shown (3 of 4)]
[im 1/112]
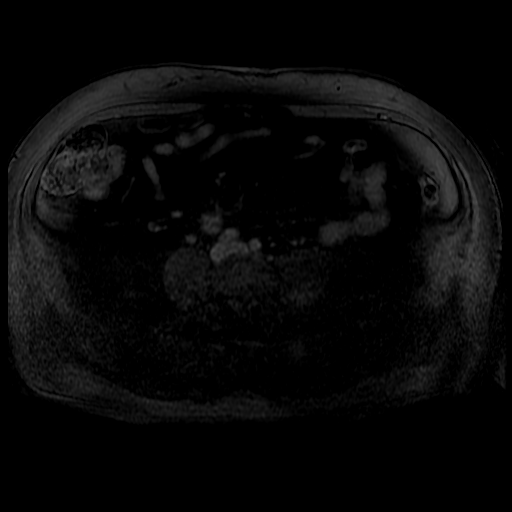
[im 56/112]
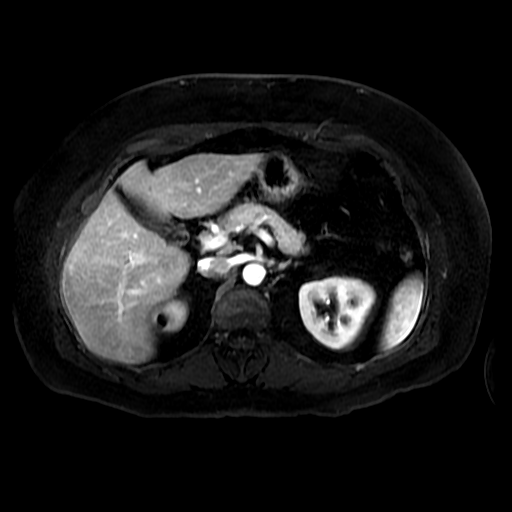
[im 112/112]
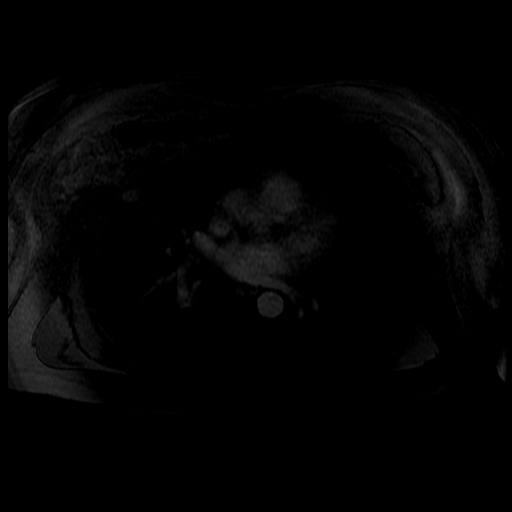

[Series 1302: T1 dynamic · axial · 5.0mm · 0.78mm/px · 1 of 112 slices shown (4 of 4)]
[im 1/112]
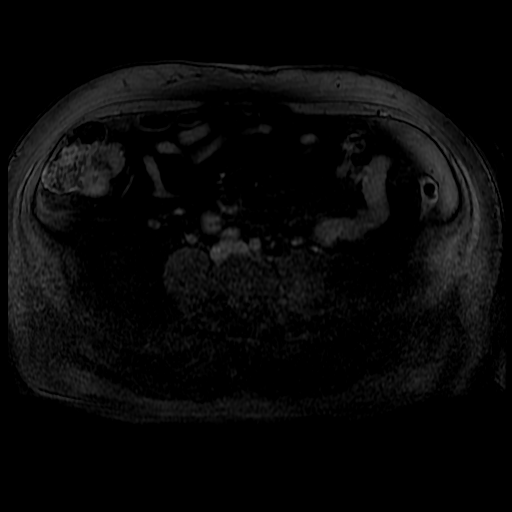

[22 of 48 positions shown; findings below may reference images not displayed]

FINDINGS: Lower chest: No acute findings.

Hepatobiliary: No hepatic masses identified. Mild diffuse hepatic
steatosis. 1.8 cm gallstone noted, however there is no evidence of
cholecystitis or biliary ductal dilatation.

Pancreas:  No mass or inflammatory changes.

Spleen:  Within normal limits in size and appearance.

Adrenals/Urinary Tract: Normal adrenal glands. A 1.7 cm benign
Bosniak category 1 renal cyst is seen upper pole the right kidney.
Other tiny sub-cm cysts are noted in both kidneys. No evidence of
renal mass or hydronephrosis.

Stomach/Bowel: Visualized portions within abdomen are unremarkable.

Vascular/Lymphatic: No pathologically enlarged lymph nodes
identified. No abdominal aortic aneurysm.

Other:  Tiny paraumbilical hernia containing only fat.

Musculoskeletal:  No suspicious bone lesions identified.
IMPRESSION: Small benign-appearing renal cysts. No evidence of renal neoplasm or
hydronephrosis.

Cholelithiasis.  No radiographic evidence of cholecystitis.

Mild hepatic steatosis.

Tiny paraumbilical ventral hernia containing only fat.

## 2021-07-23 DIAGNOSIS — J309 Allergic rhinitis, unspecified: Secondary | ICD-10-CM | POA: Diagnosis not present

## 2021-11-01 DIAGNOSIS — Z01419 Encounter for gynecological examination (general) (routine) without abnormal findings: Secondary | ICD-10-CM | POA: Diagnosis not present

## 2021-11-01 DIAGNOSIS — Z1231 Encounter for screening mammogram for malignant neoplasm of breast: Secondary | ICD-10-CM | POA: Diagnosis not present

## 2022-03-19 DIAGNOSIS — M2550 Pain in unspecified joint: Secondary | ICD-10-CM | POA: Diagnosis not present

## 2022-03-19 DIAGNOSIS — Z96651 Presence of right artificial knee joint: Secondary | ICD-10-CM | POA: Diagnosis not present

## 2022-03-19 DIAGNOSIS — Z96652 Presence of left artificial knee joint: Secondary | ICD-10-CM | POA: Diagnosis not present

## 2022-11-12 DIAGNOSIS — R7302 Impaired glucose tolerance (oral): Secondary | ICD-10-CM | POA: Diagnosis not present

## 2022-11-12 DIAGNOSIS — Z79899 Other long term (current) drug therapy: Secondary | ICD-10-CM | POA: Diagnosis not present

## 2022-11-12 DIAGNOSIS — Z13220 Encounter for screening for lipoid disorders: Secondary | ICD-10-CM | POA: Diagnosis not present

## 2022-11-12 DIAGNOSIS — Z6831 Body mass index (BMI) 31.0-31.9, adult: Secondary | ICD-10-CM | POA: Diagnosis not present

## 2022-11-12 DIAGNOSIS — I10 Essential (primary) hypertension: Secondary | ICD-10-CM | POA: Diagnosis not present

## 2023-01-22 DIAGNOSIS — Z13820 Encounter for screening for osteoporosis: Secondary | ICD-10-CM | POA: Diagnosis not present

## 2023-01-22 DIAGNOSIS — Z780 Asymptomatic menopausal state: Secondary | ICD-10-CM | POA: Diagnosis not present

## 2023-01-22 DIAGNOSIS — Z0000 Encounter for general adult medical examination without abnormal findings: Secondary | ICD-10-CM | POA: Diagnosis not present

## 2023-01-22 DIAGNOSIS — Z6832 Body mass index (BMI) 32.0-32.9, adult: Secondary | ICD-10-CM | POA: Diagnosis not present

## 2023-01-22 DIAGNOSIS — Z1331 Encounter for screening for depression: Secondary | ICD-10-CM | POA: Diagnosis not present

## 2023-01-22 DIAGNOSIS — R7302 Impaired glucose tolerance (oral): Secondary | ICD-10-CM | POA: Diagnosis not present

## 2023-01-22 DIAGNOSIS — I10 Essential (primary) hypertension: Secondary | ICD-10-CM | POA: Diagnosis not present

## 2023-01-28 DIAGNOSIS — Z6831 Body mass index (BMI) 31.0-31.9, adult: Secondary | ICD-10-CM | POA: Diagnosis not present

## 2023-01-28 DIAGNOSIS — Z1231 Encounter for screening mammogram for malignant neoplasm of breast: Secondary | ICD-10-CM | POA: Diagnosis not present

## 2023-01-28 DIAGNOSIS — Z01419 Encounter for gynecological examination (general) (routine) without abnormal findings: Secondary | ICD-10-CM | POA: Diagnosis not present

## 2023-08-28 DIAGNOSIS — I10 Essential (primary) hypertension: Secondary | ICD-10-CM | POA: Diagnosis not present

## 2023-08-28 DIAGNOSIS — Z780 Asymptomatic menopausal state: Secondary | ICD-10-CM | POA: Diagnosis not present

## 2023-08-28 DIAGNOSIS — Z6832 Body mass index (BMI) 32.0-32.9, adult: Secondary | ICD-10-CM | POA: Diagnosis not present

## 2023-09-09 DIAGNOSIS — J302 Other seasonal allergic rhinitis: Secondary | ICD-10-CM | POA: Diagnosis not present

## 2023-09-09 DIAGNOSIS — Z6831 Body mass index (BMI) 31.0-31.9, adult: Secondary | ICD-10-CM | POA: Diagnosis not present

## 2023-09-09 DIAGNOSIS — H6692 Otitis media, unspecified, left ear: Secondary | ICD-10-CM | POA: Diagnosis not present

## 2023-09-29 DIAGNOSIS — J029 Acute pharyngitis, unspecified: Secondary | ICD-10-CM | POA: Diagnosis not present

## 2023-09-29 DIAGNOSIS — Z6832 Body mass index (BMI) 32.0-32.9, adult: Secondary | ICD-10-CM | POA: Diagnosis not present

## 2023-09-29 DIAGNOSIS — J40 Bronchitis, not specified as acute or chronic: Secondary | ICD-10-CM | POA: Diagnosis not present

## 2023-09-29 DIAGNOSIS — J329 Chronic sinusitis, unspecified: Secondary | ICD-10-CM | POA: Diagnosis not present

## 2024-01-13 DIAGNOSIS — N951 Menopausal and female climacteric states: Secondary | ICD-10-CM | POA: Diagnosis not present

## 2024-01-13 DIAGNOSIS — Z79899 Other long term (current) drug therapy: Secondary | ICD-10-CM | POA: Diagnosis not present

## 2024-01-13 DIAGNOSIS — Z1331 Encounter for screening for depression: Secondary | ICD-10-CM | POA: Diagnosis not present

## 2024-01-13 DIAGNOSIS — Z131 Encounter for screening for diabetes mellitus: Secondary | ICD-10-CM | POA: Diagnosis not present

## 2024-01-13 DIAGNOSIS — Z1339 Encounter for screening examination for other mental health and behavioral disorders: Secondary | ICD-10-CM | POA: Diagnosis not present

## 2024-01-13 DIAGNOSIS — Z136 Encounter for screening for cardiovascular disorders: Secondary | ICD-10-CM | POA: Diagnosis not present

## 2024-01-13 DIAGNOSIS — Z6832 Body mass index (BMI) 32.0-32.9, adult: Secondary | ICD-10-CM | POA: Diagnosis not present

## 2024-01-13 DIAGNOSIS — I10 Essential (primary) hypertension: Secondary | ICD-10-CM | POA: Diagnosis not present

## 2024-01-13 DIAGNOSIS — Z0000 Encounter for general adult medical examination without abnormal findings: Secondary | ICD-10-CM | POA: Diagnosis not present

## 2024-02-03 DIAGNOSIS — N3941 Urge incontinence: Secondary | ICD-10-CM | POA: Diagnosis not present

## 2024-02-03 DIAGNOSIS — B372 Candidiasis of skin and nail: Secondary | ICD-10-CM | POA: Diagnosis not present

## 2024-02-03 DIAGNOSIS — Z01411 Encounter for gynecological examination (general) (routine) with abnormal findings: Secondary | ICD-10-CM | POA: Diagnosis not present

## 2024-02-10 DIAGNOSIS — H35372 Puckering of macula, left eye: Secondary | ICD-10-CM | POA: Diagnosis not present

## 2024-02-10 DIAGNOSIS — Z961 Presence of intraocular lens: Secondary | ICD-10-CM | POA: Diagnosis not present

## 2024-03-02 DIAGNOSIS — M7541 Impingement syndrome of right shoulder: Secondary | ICD-10-CM | POA: Diagnosis not present
